# Patient Record
Sex: Male | Born: 1935
Health system: Southern US, Community
[De-identification: ages and names within clinical notes are randomized; demographics above are authoritative.]

## PROBLEM LIST (undated history)

## (undated) DIAGNOSIS — I639 Cerebral infarction, unspecified: Secondary | ICD-10-CM

## (undated) DIAGNOSIS — R7303 Prediabetes: Secondary | ICD-10-CM

## (undated) DIAGNOSIS — E559 Vitamin D deficiency, unspecified: Secondary | ICD-10-CM

## (undated) DIAGNOSIS — I10 Essential (primary) hypertension: Secondary | ICD-10-CM

## (undated) DIAGNOSIS — I2510 Atherosclerotic heart disease of native coronary artery without angina pectoris: Secondary | ICD-10-CM

## (undated) HISTORY — DX: Atherosclerotic heart disease of native coronary artery without angina pectoris: I25.10

## (undated) HISTORY — DX: Essential (primary) hypertension: I10

## (undated) HISTORY — DX: Vitamin D deficiency, unspecified: E55.9

## (undated) HISTORY — DX: Prediabetes: R73.03

---
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
## 1990-08-08 HISTORY — PX: COLECTOMY: SHX59

## 1991-08-09 HISTORY — PX: CATARACT EXTRACTION W/ INTRAOCULAR LENS IMPLANT: SHX1309

## 1997-12-25 ENCOUNTER — Other Ambulatory Visit: Admission: RE | Admit: 1997-12-25 | Discharge: 1997-12-25 | Payer: Self-pay | Admitting: Urology

## 1998-01-01 ENCOUNTER — Ambulatory Visit (HOSPITAL_COMMUNITY): Admission: RE | Admit: 1998-01-01 | Discharge: 1998-01-01 | Payer: Self-pay | Admitting: Internal Medicine

## 1999-08-09 HISTORY — PX: CHOLECYSTECTOMY: SHX55

## 1999-08-09 HISTORY — PX: CATARACT EXTRACTION W/ INTRAOCULAR LENS IMPLANT: SHX1309

## 1999-09-02 ENCOUNTER — Encounter: Payer: Self-pay | Admitting: Surgery

## 1999-09-02 ENCOUNTER — Encounter: Admission: RE | Admit: 1999-09-02 | Discharge: 1999-09-02 | Payer: Self-pay | Admitting: Surgery

## 1999-09-06 ENCOUNTER — Encounter: Payer: Self-pay | Admitting: Surgery

## 1999-09-06 ENCOUNTER — Ambulatory Visit: Admission: RE | Admit: 1999-09-06 | Discharge: 1999-09-06 | Payer: Self-pay | Admitting: Surgery

## 1999-09-10 ENCOUNTER — Encounter: Payer: Self-pay | Admitting: Surgery

## 1999-09-10 ENCOUNTER — Ambulatory Visit (HOSPITAL_COMMUNITY): Admission: RE | Admit: 1999-09-10 | Discharge: 1999-09-11 | Payer: Self-pay | Admitting: Surgery

## 2001-04-26 ENCOUNTER — Other Ambulatory Visit: Admission: RE | Admit: 2001-04-26 | Discharge: 2001-04-26 | Payer: Self-pay | Admitting: Urology

## 2002-01-15 ENCOUNTER — Ambulatory Visit (HOSPITAL_COMMUNITY): Admission: RE | Admit: 2002-01-15 | Discharge: 2002-01-15 | Payer: Self-pay | Admitting: Internal Medicine

## 2002-01-15 ENCOUNTER — Encounter: Payer: Self-pay | Admitting: Internal Medicine

## 2003-05-15 ENCOUNTER — Emergency Department (HOSPITAL_COMMUNITY): Admission: EM | Admit: 2003-05-15 | Discharge: 2003-05-16 | Payer: Self-pay | Admitting: Emergency Medicine

## 2010-11-01 ENCOUNTER — Other Ambulatory Visit: Payer: Self-pay | Admitting: Dermatology

## 2011-05-02 ENCOUNTER — Other Ambulatory Visit: Payer: Self-pay | Admitting: Dermatology

## 2011-05-26 ENCOUNTER — Other Ambulatory Visit: Payer: Self-pay | Admitting: Dermatology

## 2011-08-31 DIAGNOSIS — I10 Essential (primary) hypertension: Secondary | ICD-10-CM | POA: Diagnosis not present

## 2011-08-31 DIAGNOSIS — R7309 Other abnormal glucose: Secondary | ICD-10-CM | POA: Diagnosis not present

## 2011-08-31 DIAGNOSIS — E782 Mixed hyperlipidemia: Secondary | ICD-10-CM | POA: Diagnosis not present

## 2011-08-31 DIAGNOSIS — Z79899 Other long term (current) drug therapy: Secondary | ICD-10-CM | POA: Diagnosis not present

## 2011-08-31 DIAGNOSIS — C61 Malignant neoplasm of prostate: Secondary | ICD-10-CM | POA: Diagnosis not present

## 2011-08-31 DIAGNOSIS — N419 Inflammatory disease of prostate, unspecified: Secondary | ICD-10-CM | POA: Diagnosis not present

## 2011-08-31 DIAGNOSIS — E559 Vitamin D deficiency, unspecified: Secondary | ICD-10-CM | POA: Diagnosis not present

## 2011-10-13 DIAGNOSIS — H1045 Other chronic allergic conjunctivitis: Secondary | ICD-10-CM | POA: Diagnosis not present

## 2011-10-13 DIAGNOSIS — H532 Diplopia: Secondary | ICD-10-CM | POA: Diagnosis not present

## 2011-10-13 DIAGNOSIS — H40019 Open angle with borderline findings, low risk, unspecified eye: Secondary | ICD-10-CM | POA: Diagnosis not present

## 2011-10-13 DIAGNOSIS — E119 Type 2 diabetes mellitus without complications: Secondary | ICD-10-CM | POA: Diagnosis not present

## 2011-10-13 DIAGNOSIS — H04129 Dry eye syndrome of unspecified lacrimal gland: Secondary | ICD-10-CM | POA: Diagnosis not present

## 2011-10-17 DIAGNOSIS — N419 Inflammatory disease of prostate, unspecified: Secondary | ICD-10-CM | POA: Diagnosis not present

## 2011-10-31 ENCOUNTER — Other Ambulatory Visit: Payer: Self-pay | Admitting: Dermatology

## 2011-10-31 DIAGNOSIS — D2370 Other benign neoplasm of skin of unspecified lower limb, including hip: Secondary | ICD-10-CM | POA: Diagnosis not present

## 2011-10-31 DIAGNOSIS — C44711 Basal cell carcinoma of skin of unspecified lower limb, including hip: Secondary | ICD-10-CM | POA: Diagnosis not present

## 2011-12-01 DIAGNOSIS — N3000 Acute cystitis without hematuria: Secondary | ICD-10-CM | POA: Diagnosis not present

## 2011-12-01 DIAGNOSIS — I10 Essential (primary) hypertension: Secondary | ICD-10-CM | POA: Diagnosis not present

## 2011-12-01 DIAGNOSIS — E559 Vitamin D deficiency, unspecified: Secondary | ICD-10-CM | POA: Diagnosis not present

## 2011-12-01 DIAGNOSIS — E782 Mixed hyperlipidemia: Secondary | ICD-10-CM | POA: Diagnosis not present

## 2011-12-01 DIAGNOSIS — R7309 Other abnormal glucose: Secondary | ICD-10-CM | POA: Diagnosis not present

## 2012-01-04 DIAGNOSIS — S20219A Contusion of unspecified front wall of thorax, initial encounter: Secondary | ICD-10-CM | POA: Diagnosis not present

## 2012-03-01 DIAGNOSIS — R7309 Other abnormal glucose: Secondary | ICD-10-CM | POA: Diagnosis not present

## 2012-03-01 DIAGNOSIS — E782 Mixed hyperlipidemia: Secondary | ICD-10-CM | POA: Diagnosis not present

## 2012-03-01 DIAGNOSIS — Z79899 Other long term (current) drug therapy: Secondary | ICD-10-CM | POA: Diagnosis not present

## 2012-03-01 DIAGNOSIS — N3000 Acute cystitis without hematuria: Secondary | ICD-10-CM | POA: Diagnosis not present

## 2012-03-01 DIAGNOSIS — E559 Vitamin D deficiency, unspecified: Secondary | ICD-10-CM | POA: Diagnosis not present

## 2012-03-01 DIAGNOSIS — I10 Essential (primary) hypertension: Secondary | ICD-10-CM | POA: Diagnosis not present

## 2012-03-12 DIAGNOSIS — H02839 Dermatochalasis of unspecified eye, unspecified eyelid: Secondary | ICD-10-CM | POA: Diagnosis not present

## 2012-03-12 DIAGNOSIS — Z961 Presence of intraocular lens: Secondary | ICD-10-CM | POA: Diagnosis not present

## 2012-03-12 DIAGNOSIS — H35379 Puckering of macula, unspecified eye: Secondary | ICD-10-CM | POA: Diagnosis not present

## 2012-03-12 DIAGNOSIS — H538 Other visual disturbances: Secondary | ICD-10-CM | POA: Diagnosis not present

## 2012-03-12 DIAGNOSIS — H04129 Dry eye syndrome of unspecified lacrimal gland: Secondary | ICD-10-CM | POA: Diagnosis not present

## 2012-03-12 DIAGNOSIS — H40019 Open angle with borderline findings, low risk, unspecified eye: Secondary | ICD-10-CM | POA: Diagnosis not present

## 2012-03-12 DIAGNOSIS — H353190 Nonexudative age-related macular degeneration, unspecified eye, stage unspecified: Secondary | ICD-10-CM | POA: Diagnosis not present

## 2012-04-25 DIAGNOSIS — C61 Malignant neoplasm of prostate: Secondary | ICD-10-CM | POA: Diagnosis not present

## 2012-04-30 ENCOUNTER — Other Ambulatory Visit: Payer: Self-pay | Admitting: Dermatology

## 2012-04-30 DIAGNOSIS — C44519 Basal cell carcinoma of skin of other part of trunk: Secondary | ICD-10-CM | POA: Diagnosis not present

## 2012-04-30 DIAGNOSIS — L821 Other seborrheic keratosis: Secondary | ICD-10-CM | POA: Diagnosis not present

## 2012-04-30 DIAGNOSIS — D1801 Hemangioma of skin and subcutaneous tissue: Secondary | ICD-10-CM | POA: Diagnosis not present

## 2012-04-30 DIAGNOSIS — L570 Actinic keratosis: Secondary | ICD-10-CM | POA: Diagnosis not present

## 2012-04-30 DIAGNOSIS — D485 Neoplasm of uncertain behavior of skin: Secondary | ICD-10-CM | POA: Diagnosis not present

## 2012-04-30 DIAGNOSIS — D219 Benign neoplasm of connective and other soft tissue, unspecified: Secondary | ICD-10-CM | POA: Diagnosis not present

## 2012-04-30 DIAGNOSIS — C61 Malignant neoplasm of prostate: Secondary | ICD-10-CM | POA: Diagnosis not present

## 2012-04-30 DIAGNOSIS — R351 Nocturia: Secondary | ICD-10-CM | POA: Diagnosis not present

## 2012-04-30 DIAGNOSIS — D239 Other benign neoplasm of skin, unspecified: Secondary | ICD-10-CM | POA: Diagnosis not present

## 2012-06-05 DIAGNOSIS — E781 Pure hyperglyceridemia: Secondary | ICD-10-CM | POA: Diagnosis not present

## 2012-06-05 DIAGNOSIS — Z79899 Other long term (current) drug therapy: Secondary | ICD-10-CM | POA: Diagnosis not present

## 2012-06-05 DIAGNOSIS — R351 Nocturia: Secondary | ICD-10-CM | POA: Diagnosis not present

## 2012-06-05 DIAGNOSIS — J019 Acute sinusitis, unspecified: Secondary | ICD-10-CM | POA: Diagnosis not present

## 2012-06-05 DIAGNOSIS — E291 Testicular hypofunction: Secondary | ICD-10-CM | POA: Diagnosis not present

## 2012-06-05 DIAGNOSIS — R5383 Other fatigue: Secondary | ICD-10-CM | POA: Diagnosis not present

## 2012-06-05 DIAGNOSIS — I10 Essential (primary) hypertension: Secondary | ICD-10-CM | POA: Diagnosis not present

## 2012-06-05 DIAGNOSIS — R972 Elevated prostate specific antigen [PSA]: Secondary | ICD-10-CM | POA: Diagnosis not present

## 2012-06-05 DIAGNOSIS — Z23 Encounter for immunization: Secondary | ICD-10-CM | POA: Diagnosis not present

## 2012-07-03 DIAGNOSIS — S46819A Strain of other muscles, fascia and tendons at shoulder and upper arm level, unspecified arm, initial encounter: Secondary | ICD-10-CM | POA: Diagnosis not present

## 2012-07-03 DIAGNOSIS — S43499A Other sprain of unspecified shoulder joint, initial encounter: Secondary | ICD-10-CM | POA: Diagnosis not present

## 2012-09-07 DIAGNOSIS — Z79899 Other long term (current) drug therapy: Secondary | ICD-10-CM | POA: Diagnosis not present

## 2012-09-07 DIAGNOSIS — R7309 Other abnormal glucose: Secondary | ICD-10-CM | POA: Diagnosis not present

## 2012-09-07 DIAGNOSIS — C61 Malignant neoplasm of prostate: Secondary | ICD-10-CM | POA: Diagnosis not present

## 2012-09-07 DIAGNOSIS — E782 Mixed hyperlipidemia: Secondary | ICD-10-CM | POA: Diagnosis not present

## 2012-09-07 DIAGNOSIS — E559 Vitamin D deficiency, unspecified: Secondary | ICD-10-CM | POA: Diagnosis not present

## 2012-09-07 DIAGNOSIS — I10 Essential (primary) hypertension: Secondary | ICD-10-CM | POA: Diagnosis not present

## 2012-09-27 ENCOUNTER — Other Ambulatory Visit: Payer: Self-pay | Admitting: Gastroenterology

## 2012-09-27 DIAGNOSIS — K921 Melena: Secondary | ICD-10-CM

## 2012-10-04 ENCOUNTER — Ambulatory Visit
Admission: RE | Admit: 2012-10-04 | Discharge: 2012-10-04 | Disposition: A | Discharge status: Home / Self Care | Payer: Medicare Other | Source: Ambulatory Visit | Attending: Gastroenterology | Admitting: Gastroenterology

## 2012-10-04 ENCOUNTER — Other Ambulatory Visit: Payer: Self-pay | Admitting: Gastroenterology

## 2012-10-04 DIAGNOSIS — K219 Gastro-esophageal reflux disease without esophagitis: Secondary | ICD-10-CM | POA: Diagnosis not present

## 2012-10-04 DIAGNOSIS — K921 Melena: Secondary | ICD-10-CM

## 2012-10-04 DIAGNOSIS — K449 Diaphragmatic hernia without obstruction or gangrene: Secondary | ICD-10-CM | POA: Diagnosis not present

## 2012-10-10 DIAGNOSIS — H04129 Dry eye syndrome of unspecified lacrimal gland: Secondary | ICD-10-CM | POA: Diagnosis not present

## 2012-10-10 DIAGNOSIS — H40019 Open angle with borderline findings, low risk, unspecified eye: Secondary | ICD-10-CM | POA: Diagnosis not present

## 2012-10-10 DIAGNOSIS — H02839 Dermatochalasis of unspecified eye, unspecified eyelid: Secondary | ICD-10-CM | POA: Diagnosis not present

## 2012-10-29 ENCOUNTER — Other Ambulatory Visit: Payer: Self-pay | Admitting: Dermatology

## 2012-10-29 DIAGNOSIS — C44711 Basal cell carcinoma of skin of unspecified lower limb, including hip: Secondary | ICD-10-CM | POA: Diagnosis not present

## 2012-10-29 DIAGNOSIS — L821 Other seborrheic keratosis: Secondary | ICD-10-CM | POA: Diagnosis not present

## 2012-10-29 DIAGNOSIS — L570 Actinic keratosis: Secondary | ICD-10-CM | POA: Diagnosis not present

## 2012-10-29 DIAGNOSIS — D485 Neoplasm of uncertain behavior of skin: Secondary | ICD-10-CM | POA: Diagnosis not present

## 2012-12-10 DIAGNOSIS — H1045 Other chronic allergic conjunctivitis: Secondary | ICD-10-CM | POA: Diagnosis not present

## 2012-12-10 DIAGNOSIS — H40019 Open angle with borderline findings, low risk, unspecified eye: Secondary | ICD-10-CM | POA: Diagnosis not present

## 2012-12-10 DIAGNOSIS — H04129 Dry eye syndrome of unspecified lacrimal gland: Secondary | ICD-10-CM | POA: Diagnosis not present

## 2012-12-18 ENCOUNTER — Other Ambulatory Visit: Payer: Self-pay | Admitting: Dermatology

## 2012-12-18 DIAGNOSIS — Z85828 Personal history of other malignant neoplasm of skin: Secondary | ICD-10-CM | POA: Diagnosis not present

## 2012-12-18 DIAGNOSIS — D485 Neoplasm of uncertain behavior of skin: Secondary | ICD-10-CM | POA: Diagnosis not present

## 2012-12-18 DIAGNOSIS — C44721 Squamous cell carcinoma of skin of unspecified lower limb, including hip: Secondary | ICD-10-CM | POA: Diagnosis not present

## 2013-02-01 DIAGNOSIS — E782 Mixed hyperlipidemia: Secondary | ICD-10-CM | POA: Diagnosis not present

## 2013-02-01 DIAGNOSIS — I10 Essential (primary) hypertension: Secondary | ICD-10-CM | POA: Diagnosis not present

## 2013-02-01 DIAGNOSIS — L089 Local infection of the skin and subcutaneous tissue, unspecified: Secondary | ICD-10-CM | POA: Diagnosis not present

## 2013-02-01 DIAGNOSIS — E559 Vitamin D deficiency, unspecified: Secondary | ICD-10-CM | POA: Diagnosis not present

## 2013-02-01 DIAGNOSIS — W57XXXA Bitten or stung by nonvenomous insect and other nonvenomous arthropods, initial encounter: Secondary | ICD-10-CM | POA: Diagnosis not present

## 2013-02-01 DIAGNOSIS — R7309 Other abnormal glucose: Secondary | ICD-10-CM | POA: Diagnosis not present

## 2013-02-01 DIAGNOSIS — Z79899 Other long term (current) drug therapy: Secondary | ICD-10-CM | POA: Diagnosis not present

## 2013-04-10 DIAGNOSIS — H353190 Nonexudative age-related macular degeneration, unspecified eye, stage unspecified: Secondary | ICD-10-CM | POA: Diagnosis not present

## 2013-04-10 DIAGNOSIS — H40019 Open angle with borderline findings, low risk, unspecified eye: Secondary | ICD-10-CM | POA: Diagnosis not present

## 2013-04-10 DIAGNOSIS — Z961 Presence of intraocular lens: Secondary | ICD-10-CM | POA: Diagnosis not present

## 2013-04-10 DIAGNOSIS — H04129 Dry eye syndrome of unspecified lacrimal gland: Secondary | ICD-10-CM | POA: Diagnosis not present

## 2013-04-10 DIAGNOSIS — H35379 Puckering of macula, unspecified eye: Secondary | ICD-10-CM | POA: Diagnosis not present

## 2013-04-10 DIAGNOSIS — H43819 Vitreous degeneration, unspecified eye: Secondary | ICD-10-CM | POA: Diagnosis not present

## 2013-04-10 DIAGNOSIS — H35039 Hypertensive retinopathy, unspecified eye: Secondary | ICD-10-CM | POA: Diagnosis not present

## 2013-04-10 DIAGNOSIS — H4720 Unspecified optic atrophy: Secondary | ICD-10-CM | POA: Diagnosis not present

## 2013-05-01 ENCOUNTER — Other Ambulatory Visit: Payer: Self-pay | Admitting: Dermatology

## 2013-05-01 DIAGNOSIS — D1801 Hemangioma of skin and subcutaneous tissue: Secondary | ICD-10-CM | POA: Diagnosis not present

## 2013-05-01 DIAGNOSIS — L821 Other seborrheic keratosis: Secondary | ICD-10-CM | POA: Diagnosis not present

## 2013-05-01 DIAGNOSIS — L570 Actinic keratosis: Secondary | ICD-10-CM | POA: Diagnosis not present

## 2013-05-01 DIAGNOSIS — C4441 Basal cell carcinoma of skin of scalp and neck: Secondary | ICD-10-CM | POA: Diagnosis not present

## 2013-05-01 DIAGNOSIS — C4320 Malignant melanoma of unspecified ear and external auricular canal: Secondary | ICD-10-CM | POA: Diagnosis not present

## 2013-05-01 DIAGNOSIS — Z85828 Personal history of other malignant neoplasm of skin: Secondary | ICD-10-CM | POA: Diagnosis not present

## 2013-05-01 DIAGNOSIS — D485 Neoplasm of uncertain behavior of skin: Secondary | ICD-10-CM | POA: Diagnosis not present

## 2013-05-01 DIAGNOSIS — C44519 Basal cell carcinoma of skin of other part of trunk: Secondary | ICD-10-CM | POA: Diagnosis not present

## 2013-05-13 DIAGNOSIS — C61 Malignant neoplasm of prostate: Secondary | ICD-10-CM | POA: Diagnosis not present

## 2013-05-21 DIAGNOSIS — C61 Malignant neoplasm of prostate: Secondary | ICD-10-CM | POA: Diagnosis not present

## 2013-05-21 DIAGNOSIS — R351 Nocturia: Secondary | ICD-10-CM | POA: Diagnosis not present

## 2013-05-21 DIAGNOSIS — R3913 Splitting of urinary stream: Secondary | ICD-10-CM | POA: Diagnosis not present

## 2013-06-03 DIAGNOSIS — E782 Mixed hyperlipidemia: Secondary | ICD-10-CM | POA: Diagnosis not present

## 2013-06-03 DIAGNOSIS — E559 Vitamin D deficiency, unspecified: Secondary | ICD-10-CM | POA: Diagnosis not present

## 2013-06-03 DIAGNOSIS — Z23 Encounter for immunization: Secondary | ICD-10-CM | POA: Diagnosis not present

## 2013-06-03 DIAGNOSIS — R7309 Other abnormal glucose: Secondary | ICD-10-CM | POA: Diagnosis not present

## 2013-06-03 DIAGNOSIS — Z79899 Other long term (current) drug therapy: Secondary | ICD-10-CM | POA: Diagnosis not present

## 2013-06-03 DIAGNOSIS — I10 Essential (primary) hypertension: Secondary | ICD-10-CM | POA: Diagnosis not present

## 2013-07-08 ENCOUNTER — Other Ambulatory Visit: Payer: Self-pay | Admitting: Physician Assistant

## 2013-07-08 MED ORDER — ALPRAZOLAM 1 MG PO TABS: 1.0000 mg | ORAL_TABLET | Freq: Three times a day (TID) | ORAL | Status: DC | PRN

## 2013-09-08 DIAGNOSIS — R7309 Other abnormal glucose: Secondary | ICD-10-CM | POA: Insufficient documentation

## 2013-09-08 DIAGNOSIS — I10 Essential (primary) hypertension: Secondary | ICD-10-CM | POA: Insufficient documentation

## 2013-09-08 DIAGNOSIS — I2510 Atherosclerotic heart disease of native coronary artery without angina pectoris: Secondary | ICD-10-CM | POA: Insufficient documentation

## 2013-09-10 ENCOUNTER — Other Ambulatory Visit: Payer: Self-pay | Admitting: Internal Medicine

## 2013-09-12 ENCOUNTER — Encounter: Payer: Self-pay | Admitting: Internal Medicine

## 2013-09-12 ENCOUNTER — Ambulatory Visit (INDEPENDENT_AMBULATORY_CARE_PROVIDER_SITE_OTHER): Payer: Medicare Other | Admitting: Internal Medicine

## 2013-09-12 VITALS — BP 150/70 | HR 56 | Temp 97.9°F | Resp 16 | Ht 71.5 in | Wt 186.0 lb

## 2013-09-12 DIAGNOSIS — Z79899 Other long term (current) drug therapy: Secondary | ICD-10-CM | POA: Diagnosis not present

## 2013-09-12 DIAGNOSIS — E559 Vitamin D deficiency, unspecified: Secondary | ICD-10-CM | POA: Diagnosis not present

## 2013-09-12 DIAGNOSIS — E782 Mixed hyperlipidemia: Secondary | ICD-10-CM | POA: Insufficient documentation

## 2013-09-12 DIAGNOSIS — R7309 Other abnormal glucose: Secondary | ICD-10-CM | POA: Diagnosis not present

## 2013-09-12 DIAGNOSIS — Z0000 Encounter for general adult medical examination without abnormal findings: Secondary | ICD-10-CM

## 2013-09-12 DIAGNOSIS — I10 Essential (primary) hypertension: Secondary | ICD-10-CM

## 2013-09-12 DIAGNOSIS — Z125 Encounter for screening for malignant neoplasm of prostate: Secondary | ICD-10-CM

## 2013-09-12 DIAGNOSIS — Z1212 Encounter for screening for malignant neoplasm of rectum: Secondary | ICD-10-CM

## 2013-09-12 DIAGNOSIS — R7303 Prediabetes: Secondary | ICD-10-CM

## 2013-09-12 LAB — HEPATIC FUNCTION PANEL
ALK PHOS: 69 U/L (ref 39–117)
ALT: 12 U/L (ref 0–53)
AST: 16 U/L (ref 0–37)
Albumin: 3.6 g/dL (ref 3.5–5.2)
BILIRUBIN DIRECT: 0.1 mg/dL (ref 0.0–0.3)
BILIRUBIN INDIRECT: 0.4 mg/dL (ref 0.2–1.2)
Total Bilirubin: 0.5 mg/dL (ref 0.2–1.2)
Total Protein: 6.1 g/dL (ref 6.0–8.3)

## 2013-09-12 LAB — CBC WITH DIFFERENTIAL/PLATELET
Basophils Absolute: 0 10*3/uL (ref 0.0–0.1)
Basophils Relative: 0 % (ref 0–1)
EOS ABS: 0.1 10*3/uL (ref 0.0–0.7)
Eosinophils Relative: 2 % (ref 0–5)
HCT: 40.5 % (ref 39.0–52.0)
HEMOGLOBIN: 14.1 g/dL (ref 13.0–17.0)
LYMPHS ABS: 1.2 10*3/uL (ref 0.7–4.0)
Lymphocytes Relative: 22 % (ref 12–46)
MCH: 32 pg (ref 26.0–34.0)
MCHC: 34.8 g/dL (ref 30.0–36.0)
MCV: 92 fL (ref 78.0–100.0)
MONOS PCT: 14 % — AB (ref 3–12)
Monocytes Absolute: 0.8 10*3/uL (ref 0.1–1.0)
NEUTROS PCT: 62 % (ref 43–77)
Neutro Abs: 3.4 10*3/uL (ref 1.7–7.7)
Platelets: 216 10*3/uL (ref 150–400)
RBC: 4.4 MIL/uL (ref 4.22–5.81)
RDW: 13.6 % (ref 11.5–15.5)
WBC: 5.5 10*3/uL (ref 4.0–10.5)

## 2013-09-12 LAB — BASIC METABOLIC PANEL WITH GFR
BUN: 14 mg/dL (ref 6–23)
CALCIUM: 9.3 mg/dL (ref 8.4–10.5)
CO2: 28 meq/L (ref 19–32)
Chloride: 104 mEq/L (ref 96–112)
Creat: 0.95 mg/dL (ref 0.50–1.35)
GFR, Est African American: 82 mL/min
GFR, Est Non African American: 71 mL/min
GLUCOSE: 90 mg/dL (ref 70–99)
POTASSIUM: 4.7 meq/L (ref 3.5–5.3)
SODIUM: 140 meq/L (ref 135–145)

## 2013-09-12 LAB — LIPID PANEL
CHOL/HDL RATIO: 3.3 ratio
Cholesterol: 144 mg/dL (ref 0–200)
HDL: 43 mg/dL (ref >39)
LDL CALC: 66 mg/dL (ref 0–99)
Triglycerides: 175 mg/dL — ABNORMAL HIGH (ref <150)
VLDL: 35 mg/dL (ref 0–40)

## 2013-09-12 LAB — TSH: TSH: 3.007 u[IU]/mL (ref 0.350–4.500)

## 2013-09-12 LAB — HEMOGLOBIN A1C
HEMOGLOBIN A1C: 5.1 % (ref <5.7)
Mean Plasma Glucose: 100 mg/dL (ref <117)

## 2013-09-12 LAB — MAGNESIUM: Magnesium: 1.8 mg/dL (ref 1.5–2.5)

## 2013-09-12 MED ORDER — VITAMIN D 50 MCG (2000 UT) PO TABS: 4000.0000 [IU] | ORAL_TABLET | Freq: Every day | ORAL | Status: DC

## 2013-09-12 MED ORDER — DIGOXIN 250 MCG PO TABS: 0.2500 mg | ORAL_TABLET | Freq: Every day | ORAL | Status: DC

## 2013-09-12 NOTE — Progress Notes (Signed)
Patient ID: Joseph Cherry, male   DOB: 11/20/1921, 80 y.o.   MRN: [id number]    Annual Screening Comprehensive Examination  This very nice 80 y.o.  WWM presents for complete physical.  Patient has been followed for HTN, AASHD/pAT, Prediabetes, Hyperlipidemia, and Vitamin D Deficiency.   HTN predates since 55. Patient's BP has been controlled at home.Today's BP: 150/70 mmHg. He had a negative Cardiolyte in 2004. He also has a lon hHx/o PAT , albeit very infrequent now with reported rare transient fluttering palpitations. EKG today was suspicious for AVD and difficult to evaluate baseline. Patient denies any cardiac symptoms as chest pain, palpitations, shortness of breath, dizziness, light-headedness  or ankle swelling.   Patient's hyperlipidemia is controlled with diet and medications. Patient denies myalgias or other medication SE's. Last cholesterol last visit was 136, triglycerides 138, HDL 39 and LDL 69.     Patient has prediabetes/insulin resistance with last A1c 5.6% with slightly elevated insulin in 2008. Patient denies reactive hypoglycemic symptoms, visual blurring, diabetic polys, or paresthesias.    Finally, patient has history of Vitamin D Deficiency of 30 in 2008 and with last vitamin D59 in Oct 2014.      Medication List         ALPRAZolam 1 MG tablet  Commonly known as:  XANAX  Take 1 tablet (1 mg total) by mouth 3 (three) times daily as needed for anxiety or sleep.     aspirin 81 MG chewable tablet  Chew by mouth daily.     azelastine 0.05 % ophthalmic solution  Commonly known as:  OPTIVAR     digoxin 0.25 MG tablet  Commonly known as:  LANOXIN  Take 1 tablet (0.25 mg total) by mouth daily.     finasteride 5 MG tablet  Commonly known as:  PROSCAR  Take 5 mg by mouth daily.     fluticasone 50 MCG/ACT nasal spray  Commonly known as:  FLONASE     losartan 100 MG tablet  Commonly known as:  COZAAR  Take 100 mg by mouth daily.     montelukast 10 MG tablet   Commonly known as:  SINGULAIR  Take 10 mg by mouth at bedtime.     ranitidine 300 MG tablet  Commonly known as:  ZANTAC  Take 300 mg by mouth at bedtime.     verapamil 240 MG CR tablet  Commonly known as:  CALAN-SR  TAKE 1 TABLET TWICE DAILY AFTER MEALS FOR HEART RHYTHM.     Vitamin D 2000 UNITS tablet  Take 2 tablets (4,000 Units total) by mouth daily.        Allergies  Allergen Reactions  . Demerol [Meperidine]   . Penicillins     Past Medical History  Diagnosis Date  . Hypertension   . Prediabetes   . Vitamin D deficiency    Prostate Cancer, Hx/o    S/P total Colectomy for Diverticular Dz   . ASHD Hx/o PAT     Past Surgical History  Procedure Laterality Date  . Colectomy  1992  . Cataract extraction w/ intraocular lens implant Right 1993  . Cholecystectomy  2001  . Cataract extraction w/ intraocular lens implant Left 2001    Family History  Problem Relation Age of Onset  . AAA (abdominal aortic aneurysm) Mother   . Heart disease Mother   . Hypertension Mother   . Heart disease Father   . Heart disease Sister   . Early death Brother  at birth    History   Social History  . Marital Status: Widowed    Spouse Name: N/A    Number of Children: N/A  . Years of Education: N/A   Occupational History  . Not on file.   Social History Main Topics  . Smoking status: Current Some Day Smoker    Types: Cigars  . Smokeless tobacco: Not on file  . Alcohol Use: Yes     Comment: occ  . Drug Use: No  . Sexual Activity: Not on file   Other Topics Concern  . Not on file   Social History Narrative  . No narrative on file    ROS Constitutional: Denies fever, chills, weight loss/gain, headaches, insomnia, fatigue, night sweats, and change in appetite. Eyes: Denies redness, blurred vision, diplopia, discharge, itchy, watery eyes.  ENT: Denies discharge, congestion, post nasal drip, epistaxis, sore throat, earache, hearing loss, dental pain, Tinnitus,  Vertigo, Sinus pain, snoring.  Cardio: Denies chest pain, palpitations, irregular heartbeat, syncope, dyspnea, diaphoresis, orthopnea, PND, claudication, edema Respiratory: denies cough, dyspnea, DOE, pleurisy, hoarseness, laryngitis, wheezing.  Gastrointestinal: Denies dysphagia, heartburn, reflux, water brash, pain, cramps, nausea, vomiting, bloating, diarrhea, constipation, hematemesis, melena, hematochezia, jaundice, hemorrhoids Genitourinary: Denies dysuria, frequency, urgency, nocturia, hesitancy, discharge, hematuria, flank pain Musculoskeletal: Denies arthralgia, myalgia, stiffness, Jt. Swelling, pain, limp, and strain/sprain. Skin: Denies puritis, rash, hives, warts, acne, eczema, changing in skin lesion Neuro: No weakness, tremor, incoordination, spasms, paresthesia, pain Psychiatric: Denies confusion, memory loss, sensory loss Endocrine: Denies change in weight, skin, hair change, nocturia, and paresthesia, diabetic polys, visual blurring, hyper / hypo glycemic episodes.  Heme/Lymph: No excessive bleeding, bruising, or elarged lymph nodes.  BP: 150/70  Pulse: 56  Temp: 97.9 F (36.6 C)  Resp: 16    Estimated body mass index is 25.58 kg/(m^2) as calculated from the following:   Height as of this encounter: 5' 11.5" (1.816 m).   Weight as of this encounter: 186 lb (84.369 kg).  Physical Exam General Appearance: Well nourished, in no apparent distress. Eyes: PERRLA, EOMs, conjunctiva no swelling or erythema, normal fundi and vessels. Sinuses: No frontal/maxillary tenderness ENT/Mouth: EACs patent / TMs  nl. Nares clear without erythema, swelling, mucoid exudates. Oral hygiene is good. No erythema, swelling, or exudate. Tongue normal, non-obstructing. Tonsils not swollen or erythematous. Hearing normal.  Neck: Supple, thyroid normal. No bruits, nodes or JVD. Respiratory: Respiratory effort normal.  BS equal and clear bilateral without rales, rhonci, wheezing or stridor. Cardio:  Heart sounds are normal with regular rate and rhythm and no murmurs, rubs or gallops. Peripheral pulses are normal and equal bilaterally without edema. No aortic or femoral bruits. Chest: symmetric with normal excursions and percussion.  Abdomen: Flat, soft, with bowl sounds. Nontender, no guarding, rebound, hernias, masses, or organomegaly.  Lymphatics: Non tender without lymphadenopathy.  Genitourinary: Deferred to Dr Gaynelle Arabian Musculoskeletal: Full ROM all peripheral extremities, joint stability, 5/5 strength, and normal gait. Skin: Warm and dry without rashes, lesions, cyanosis, clubbing or  ecchymosis.  Neuro: Cranial nerves intact, reflexes equal bilaterally. Normal muscle tone, no cerebellar symptoms. Sensation intact.  Pysch: Awake and oriented X 3, normal affect, insight and judgment appropriate.   Assessment and Plan  1. Annual Screening Examination 2. Hypertension  3. Hyperlipidemia 4. Pre Diabetes/Insulin Resistance 5. Vitamin D Deficiency 6. ASHD/PHx/o PAT ? AVD / or SSS possibly secondary to Dig Toxicity - As patient is having absolutely no cardiac Sx's - he is advised to D/C Lanoxin pending lab results and  further disposition all labs. Advised if develops dizziness, fainting, etc to call 911 and go to ER - that he may need a pacemaker  Continue prudent diet as discussed, weight control, BP monitoring, regular exercise, and medications as discussed.  Discussed med effects and SE's. Routine screening labs and tests as requested with regular follow-up as recommended.

## 2013-09-12 NOTE — Patient Instructions (Signed)

## 2013-09-13 LAB — MICROALBUMIN / CREATININE URINE RATIO
Creatinine, Urine: 177 mg/dL
MICROALB UR: 0.62 mg/dL (ref 0.00–1.89)
Microalb Creat Ratio: 3.5 mg/g (ref 0.0–30.0)

## 2013-09-13 LAB — URINALYSIS, MICROSCOPIC ONLY
BACTERIA UA: NONE SEEN
CASTS: NONE SEEN
Crystals: NONE SEEN
Squamous Epithelial / LPF: NONE SEEN

## 2013-09-13 LAB — DIGOXIN LEVEL: Digoxin Level: 1.4 ng/mL (ref 0.8–2.0)

## 2013-09-13 LAB — INSULIN, FASTING: Insulin fasting, serum: 23 u[IU]/mL (ref 3–28)

## 2013-09-13 LAB — VITAMIN D 25 HYDROXY (VIT D DEFICIENCY, FRACTURES): Vit D, 25-Hydroxy: 75 ng/mL (ref 30–89)

## 2013-09-18 ENCOUNTER — Ambulatory Visit (INDEPENDENT_AMBULATORY_CARE_PROVIDER_SITE_OTHER): Payer: Medicare Other | Admitting: Internal Medicine

## 2013-09-18 ENCOUNTER — Other Ambulatory Visit: Payer: Self-pay | Admitting: Internal Medicine

## 2013-09-18 ENCOUNTER — Encounter: Payer: Self-pay | Admitting: Internal Medicine

## 2013-09-18 VITALS — BP 132/64 | HR 64 | Temp 97.7°F | Resp 18 | Wt 184.0 lb

## 2013-09-18 DIAGNOSIS — I10 Essential (primary) hypertension: Secondary | ICD-10-CM

## 2013-09-18 DIAGNOSIS — I2510 Atherosclerotic heart disease of native coronary artery without angina pectoris: Secondary | ICD-10-CM

## 2013-09-18 MED ORDER — FLUTICASONE PROPIONATE 50 MCG/ACT NA SUSP: 2.0000 | Freq: Two times a day (BID) | NASAL | Status: DC

## 2013-09-18 NOTE — Progress Notes (Signed)
Subjective:    Patient ID: Joseph Cherry, male    DOB: [birth year], 80 y.o.   MRN: [id number]   Patient had recent CPE and EKG showed Complete AV Block and as his VS were normal and he had no cardiac Sx's , his Digoxin was D/C'd and he returns today for repeat EKG. Since off Digoxin , he's had only occasional palpitations.  Palpitations  This is a chronic problem. The current episode started more than 1 year ago. The problem occurs intermittently. The problem has been gradually improving. Pertinent negatives include no anxiety, chest fullness, chest pain, coughing, diaphoresis, dizziness, fever, irregular heartbeat, malaise/fatigue, nausea, near-syncope, numbness, shortness of breath or syncope.     Medication List       This list is accurate as of: 09/18/13  1:13 PM.  Always use your most recent med list.               ALPRAZolam 1 MG tablet  Commonly known as:  XANAX  Take 1 tablet (1 mg total) by mouth 3 (three) times daily as needed for anxiety or sleep.     aspirin 81 MG chewable tablet  Chew by mouth daily.     azelastine 0.05 % ophthalmic solution  Commonly known as:  OPTIVAR     digoxin 0.25 MG tablet  Commonly known as:  LANOXIN  Take 1 tablet (0.25 mg total) by mouth daily.     finasteride 5 MG tablet  Commonly known as:  PROSCAR  Take 5 mg by mouth daily.     fluticasone 50 MCG/ACT nasal spray  Commonly known as:  FLONASE  Place 2 sprays into both nostrils 2 (two) times daily.     losartan 100 MG tablet  Commonly known as:  COZAAR  Take 100 mg by mouth daily.     montelukast 10 MG tablet  Commonly known as:  SINGULAIR  Take 10 mg by mouth at bedtime.     ranitidine 300 MG tablet  Commonly known as:  ZANTAC  Take 300 mg by mouth at bedtime.     verapamil 240 MG CR tablet  Commonly known as:  CALAN-SR  TAKE 1 TABLET TWICE DAILY AFTER MEALS FOR HEART RHYTHM.     Vitamin D 2000 UNITS tablet  Take 2 tablets (4,000 Units total) by mouth daily.        Allergies  Allergen Reactions  . Demerol [Meperidine]   . Penicillins    Past Medical History  Diagnosis Date  . Hypertension   . Prediabetes   . Vitamin D deficiency   . ASHD (arteriosclerotic heart disease)       Review of Systems  Constitutional: Negative for fever, malaise/fatigue and diaphoresis.  Respiratory: Negative for cough and shortness of breath.   Cardiovascular: Positive for palpitations. Negative for chest pain, syncope and near-syncope.  Gastrointestinal: Negative for nausea.  Neurological: Negative for dizziness and numbness.       Objective:   Physical Exam  Constitutional: He is oriented to person, place, and time. He appears well-developed and well-nourished.  HENT:  Head: Normocephalic and atraumatic.  Mouth/Throat: Oropharynx is clear and moist.  Eyes: Conjunctivae and EOM are normal. Pupils are equal, round, and reactive to light.  Neck: Normal range of motion. Neck supple. No thyromegaly present.  Cardiovascular: Normal rate, regular rhythm and normal heart sounds.  Exam reveals no gallop.   No murmur heard. Pulmonary/Chest: Effort normal and breath sounds normal. No respiratory distress. He has no wheezes.  He has no rales. He exhibits no tenderness.  Abdominal: Soft. Bowel sounds are normal.  Musculoskeletal: Normal range of motion. He exhibits no edema.  Lymphadenopathy:    He has no cervical adenopathy.  Neurological: He is alert and oriented to person, place, and time. No cranial nerve deficit. Coordination normal.  Skin: Skin is dry. No rash noted. No erythema. No pallor.   Assessment & Plan:  1. Hypertension  2. Coronary atherosclerosis of unspecified type of vessel, native or graft  - EKG 12-Lead shows NSR with 1st degree Block and NO 2sd or 3rd degree block

## 2013-10-02 ENCOUNTER — Other Ambulatory Visit: Payer: Self-pay | Admitting: Physician Assistant

## 2013-10-02 MED ORDER — ALPRAZOLAM 1 MG PO TABS: 1.0000 mg | ORAL_TABLET | Freq: Three times a day (TID) | ORAL | Status: DC | PRN

## 2013-10-16 ENCOUNTER — Other Ambulatory Visit: Payer: Self-pay | Admitting: Internal Medicine

## 2013-10-30 ENCOUNTER — Other Ambulatory Visit: Payer: Self-pay | Admitting: Dermatology

## 2013-10-30 DIAGNOSIS — Z85828 Personal history of other malignant neoplasm of skin: Secondary | ICD-10-CM | POA: Diagnosis not present

## 2013-10-30 DIAGNOSIS — L570 Actinic keratosis: Secondary | ICD-10-CM | POA: Diagnosis not present

## 2013-10-30 DIAGNOSIS — L821 Other seborrheic keratosis: Secondary | ICD-10-CM | POA: Diagnosis not present

## 2013-10-30 DIAGNOSIS — D485 Neoplasm of uncertain behavior of skin: Secondary | ICD-10-CM | POA: Diagnosis not present

## 2013-10-30 DIAGNOSIS — Z85820 Personal history of malignant melanoma of skin: Secondary | ICD-10-CM | POA: Diagnosis not present

## 2013-10-30 DIAGNOSIS — C44611 Basal cell carcinoma of skin of unspecified upper limb, including shoulder: Secondary | ICD-10-CM | POA: Diagnosis not present

## 2013-10-30 DIAGNOSIS — D1801 Hemangioma of skin and subcutaneous tissue: Secondary | ICD-10-CM | POA: Diagnosis not present

## 2013-11-13 DIAGNOSIS — H40019 Open angle with borderline findings, low risk, unspecified eye: Secondary | ICD-10-CM | POA: Diagnosis not present

## 2013-11-13 DIAGNOSIS — H0019 Chalazion unspecified eye, unspecified eyelid: Secondary | ICD-10-CM | POA: Diagnosis not present

## 2013-11-25 ENCOUNTER — Other Ambulatory Visit: Payer: Self-pay | Admitting: *Deleted

## 2013-11-25 ENCOUNTER — Ambulatory Visit (INDEPENDENT_AMBULATORY_CARE_PROVIDER_SITE_OTHER): Payer: Medicare Other | Admitting: Internal Medicine

## 2013-11-25 ENCOUNTER — Encounter: Payer: Self-pay | Admitting: Internal Medicine

## 2013-11-25 VITALS — BP 148/74 | HR 108 | Temp 99.0°F | Resp 16 | Ht 72.0 in | Wt 181.8 lb

## 2013-11-25 DIAGNOSIS — F05 Delirium due to known physiological condition: Secondary | ICD-10-CM

## 2013-11-25 DIAGNOSIS — I10 Essential (primary) hypertension: Secondary | ICD-10-CM

## 2013-11-25 DIAGNOSIS — Z79899 Other long term (current) drug therapy: Secondary | ICD-10-CM

## 2013-11-25 DIAGNOSIS — R509 Fever, unspecified: Secondary | ICD-10-CM

## 2013-11-25 DIAGNOSIS — I2510 Atherosclerotic heart disease of native coronary artery without angina pectoris: Secondary | ICD-10-CM

## 2013-11-25 LAB — CBC WITH DIFFERENTIAL/PLATELET
BASOS ABS: 0 10*3/uL (ref 0.0–0.1)
BASOS PCT: 0 % (ref 0–1)
EOS ABS: 0 10*3/uL (ref 0.0–0.7)
Eosinophils Relative: 0 % (ref 0–5)
HEMATOCRIT: 40.2 % (ref 39.0–52.0)
Hemoglobin: 14.1 g/dL (ref 13.0–17.0)
Lymphocytes Relative: 10 % — ABNORMAL LOW (ref 12–46)
Lymphs Abs: 1 10*3/uL (ref 0.7–4.0)
MCH: 32.3 pg (ref 26.0–34.0)
MCHC: 35.1 g/dL (ref 30.0–36.0)
MCV: 92.2 fL (ref 78.0–100.0)
MONO ABS: 0.6 10*3/uL (ref 0.1–1.0)
Monocytes Relative: 6 % (ref 3–12)
NEUTROS ABS: 8.1 10*3/uL — AB (ref 1.7–7.7)
Neutrophils Relative %: 84 % — ABNORMAL HIGH (ref 43–77)
Platelets: 287 10*3/uL (ref 150–400)
RBC: 4.36 MIL/uL (ref 4.22–5.81)
RDW: 13 % (ref 11.5–15.5)
WBC: 9.7 10*3/uL (ref 4.0–10.5)

## 2013-11-25 LAB — BASIC METABOLIC PANEL WITH GFR
BUN: 11 mg/dL (ref 6–23)
CHLORIDE: 99 meq/L (ref 96–112)
CO2: 27 meq/L (ref 19–32)
CREATININE: 0.92 mg/dL (ref 0.50–1.35)
Calcium: 9.4 mg/dL (ref 8.4–10.5)
GFR, EST NON AFRICAN AMERICAN: 73 mL/min
GFR, Est African American: 85 mL/min
Glucose, Bld: 122 mg/dL — ABNORMAL HIGH (ref 70–99)
POTASSIUM: 4.3 meq/L (ref 3.5–5.3)
Sodium: 137 mEq/L (ref 135–145)

## 2013-11-25 LAB — HEPATIC FUNCTION PANEL
ALK PHOS: 79 U/L (ref 39–117)
ALT: 12 U/L (ref 0–53)
AST: 20 U/L (ref 0–37)
Albumin: 4.1 g/dL (ref 3.5–5.2)
BILIRUBIN DIRECT: 0.2 mg/dL (ref 0.0–0.3)
BILIRUBIN INDIRECT: 0.7 mg/dL (ref 0.2–1.2)
TOTAL PROTEIN: 6.9 g/dL (ref 6.0–8.3)
Total Bilirubin: 0.9 mg/dL (ref 0.2–1.2)

## 2013-11-25 MED ORDER — RANITIDINE HCL 300 MG PO TABS: 300.0000 mg | ORAL_TABLET | Freq: Every day | ORAL | Status: DC

## 2013-11-25 NOTE — Patient Instructions (Signed)
Confusion Confusion is the inability to think with your usual speed or clarity. Confusion may come on quickly or slowly over time. How quickly the confusion comes on depends on the cause. Confusion can be due to any number of causes. CAUSES   Concussion, head injury, or head trauma.  Seizures.  Stroke.  Fever.  Senility.  Heightened emotional states like rage or terror.  Mental illness in which the person loses the ability to determine what is real and what is not (hallucinations).  Infections.  Toxic effects from alcohol, drugs, or prescription medicines.  Dehydration and an imbalance of salts in the body (electrolytes).  Lack of sleep.  Low blood sugar (diabetes).  Low levels of oxygen (for example from chronic lung disorders).  Drug interactions or other medication side effects.  Nutritional deficiencies, especially niacin, thiamine, vitamin C, or vitamin B.  Sudden drop in body temperature (hypothermia).  Illness in the elderly. Constipation can result in confusion. An elderly person who is hospitalized may become confused due to change in daily routine. SYMPTOMS  People often describe their thinking as cloudy or unclear when they are confused. Confusion can also include feeling disoriented. That means you are unaware of where or who you are. You may also not know what the date or time is. If confused, you may also have difficulty paying attention, remembering and making decisions. Some people also act aggressively when they are confused.  DIAGNOSIS  The medical evaluation of confusion may include:  Blood and urine tests.  X-rays.  Brain and nervous system tests.  Analyzing your brain waves (electroencphalogram or EEG).  A special X-ray (MRI) of your head or other special studies. Your physician will ask questions such as:  Do you get days and nights mixed up?  Are you awake during regular sleep times?  Do you have trouble recognizing people?  Do you  know where you are?  Do you know the date and time?  Does the confusion come and go?  Is the confusion quickly getting worse?  Has there been a recent illness?  Has there been a recent head injury?  Are you diabetic?  Do you have a lung disorder?  What medication are you taking?  Have you taken drugs or alcohol? TREATMENT  An admission to the hospital may not be needed, but a confused person should not be left alone. Stay with a family member or friend until the confusion clears. Avoid alcohol, pain relievers or sedative drugs until you have fully recovered. Do not drive until your caregiver says it is okay. HOME CARE INSTRUCTIONS What family and friends can do:  To find out if someone is confused ask him or her their name, age, and the date. If the person is unsure or answers incorrectly, he or she is confused.  Always introduce yourself, no matter how well the person knows you.  Often remind the person of his or her location.  Place a calendar and clock near the confused person.  Talk about current events and plans for the day.  Try to keep the environment calm, quiet and peaceful.  Make sure the patient keeps follow up appointments with their physician. PREVENTION  Ways to prevent confusion:  Avoid alcohol.  Eat a balanced diet.  Get enough sleep.  Do not become isolated. Spend time with other people and make plans for your days.  Keep careful watch on your blood sugar levels if you are diabetic. SEEK IMMEDIATE MEDICAL CARE IF:   You develop  severe headaches, repeated vomiting, seizures, blackouts or slurred speech.  There is increasing confusion, weakness, numbness, restlessness or personality changes.  You develop a loss of balance, have marked dizziness, feel uncoordinated or fall.  You have delusions, hallucinations or develop severe anxiety.  Your family members think you need to be rechecked. Document Released: 09/01/2004 Document Revised:  10/17/2011 Document Reviewed: 04/29/2008 [employer] Patient Information 2014 [place], [place].   Fever, Adult A fever is a higher than normal body temperature. In an adult, an oral temperature around 98.6 F (37 C) is considered normal. A temperature of 100.4 F (38 C) or higher is generally considered a fever. Mild or moderate fevers generally have no long-term effects and often do not require treatment. Extreme fever (greater than or equal to 106 F or 41.1 C) can cause seizures. The sweating that may occur with repeated or prolonged fever may cause dehydration. Elderly people can develop confusion during a fever. A measured temperature can vary with:  Age.  Time of day.  Method of measurement (mouth, underarm, rectal, or ear). The fever is confirmed by taking a temperature with a thermometer. Temperatures can be taken different ways. Some methods are accurate and some are not.  An oral temperature is used most commonly. Electronic thermometers are fast and accurate.  An ear temperature will only be accurate if the thermometer is positioned as recommended by the manufacturer.  A rectal temperature is accurate and done for those adults who have a condition where an oral temperature cannot be taken.  An underarm (axillary) temperature is not accurate and not recommended. Fever is a symptom, not a disease.  CAUSES   Infections commonly cause fever.  Some noninfectious causes for fever include:  Some arthritis conditions.  Some thyroid or adrenal gland conditions.  Some immune system conditions.  Some types of cancer.  A medicine reaction.  High doses of certain street drugs such as methamphetamine.  Dehydration.  Exposure to high outside or room temperatures.  Occasionally, the source of a fever cannot be determined. This is sometimes called a "fever of unknown origin" (FUO).  Some situations may lead to a temporary rise in body temperature that may go away on its own.  Examples are:  Childbirth.  Surgery.  Intense exercise. HOME CARE INSTRUCTIONS   Take appropriate medicines for fever. Follow dosing instructions carefully. If you use acetaminophen to reduce the fever, be careful to avoid taking other medicines that also contain acetaminophen. Do not take aspirin for a fever if you are younger than age 17. There is an association with Reye's syndrome. Reye's syndrome is a rare but potentially deadly disease.  If an infection is present and antibiotics have been prescribed, take them as directed. Finish them even if you start to feel better.  Rest as needed.  Maintain an adequate fluid intake. To prevent dehydration during an illness with prolonged or recurrent fever, you may need to drink extra fluid.Drink enough fluids to keep your urine clear or pale yellow.  Sponging or bathing with room temperature water may help reduce body temperature. Do not use ice water or alcohol sponge baths.  Dress comfortably, but do not over-bundle. SEEK MEDICAL CARE IF:   You are unable to keep fluids down.  You develop vomiting or diarrhea.  You are not feeling at least partly better after 3 days.  You develop new symptoms or problems. SEEK IMMEDIATE MEDICAL CARE IF:   You have shortness of breath or trouble breathing.  You develop excessive weakness.  You are dizzy or you faint.  You are extremely thirsty or you are making little or no urine.  You develop new pain that was not there before (such as in the head, neck, chest, back, or abdomen).  You have persistant vomiting and diarrhea for more than 1 to 2 days.  You develop a stiff neck or your eyes become sensitive to light.  You develop a skin rash.  You have a fever or persistent symptoms for more than 2 to 3 days.  You have a fever and your symptoms suddenly get worse. MAKE SURE YOU:   Understand these instructions.  Will watch your condition.  Will get help right away if you are not  doing well or get worse. Document Released: 01/18/2001 Document Revised: 10/17/2011 Document Reviewed: 05/26/2011 [facility] Patient Information 2014 [place], [place].

## 2013-11-25 NOTE — Progress Notes (Signed)
Subjective:    Patient ID: Joseph Cherry, male    DOB: [birth year], 80 y.o.   MRN: [id number]  HPI Patient is a very nice 80 yo DWM with HTN, ASHD, PreDiabetes and Vit D Deficiency who presents with a feeling of malaise for 2-3 weeks and a 2 day Hx/o Feeling confused, disoriented and forgetful. He denies any focal neurological Sx's.     Medication List       This list is accurate as of: 11/25/13  9:17 PM.  Always use your most recent med list.               ALPRAZolam 1 MG tablet  Commonly known as:  XANAX  Take 1 tablet (1 mg total) by mouth 3 (three) times daily as needed for anxiety or sleep.     aspirin 81 MG chewable tablet  Chew by mouth daily.     azelastine 0.05 % ophthalmic solution  Commonly known as:  OPTIVAR     digoxin 0.25 MG tablet  Commonly known as:  LANOXIN  Take 1 tablet (0.25 mg total) by mouth daily.     finasteride 5 MG tablet  Commonly known as:  PROSCAR  Take 5 mg by mouth daily.     fluticasone 50 MCG/ACT nasal spray  Commonly known as:  FLONASE  Place 2 sprays into both nostrils 2 (two) times daily.     losartan 100 MG tablet  Commonly known as:  COZAAR  TAKE 1/2 TO 1 TABLET DAILY FOR BLOOD PRESSURE.     montelukast 10 MG tablet  Commonly known as:  SINGULAIR  Take 10 mg by mouth at bedtime.     MUCINEX ALLERGY PO  Take by mouth. PRN     ranitidine 300 MG tablet  Commonly known as:  ZANTAC  Take 1 tablet (300 mg total) by mouth at bedtime.     verapamil 240 MG CR tablet  Commonly known as:  CALAN-SR  TAKE 1 TABLET TWICE DAILY AFTER MEALS FOR HEART RHYTHM.     Vitamin D 2000 UNITS tablet  Take 2 tablets (4,000 Units total) by mouth daily.       Allergies  Allergen Reactions  . Demerol [Meperidine]   . Penicillins    Past Medical History  Diagnosis Date  . Hypertension   . Prediabetes   . Vitamin D deficiency   . ASHD (arteriosclerotic heart disease)    Review of Systems In addition to the HPI above,  No chills,  No  Headache, No changes with Vision or hearing,  No problems swallowing food or Liquids,  No Chest pain or productive Cough or Shortness of Breath,  No Abdominal pain, No Nausea or Vommitting No Blood in stool or Urine,  No dysuria,  No new skin rashes or bruises,  No new joints pains-aches,  No new weakness, tingling, numbness in any extremity,  No polyuria, polydypsia or polyphagia,  A full 10 point Review of Systems was done, except as stated above, all other Review of Systems were negative  Objective:   Physical Exam  BP 148/74  Pulse 108  Temp 99 F   Resp 16  Ht 6'   Wt 181 lb 12.8 oz   BMI 24.65 kg/m2   HEENT - Eac's patent. TM's Nl.EOM's full. PERRLA. NasoOroPharynx clear. Neck - supple. Nl Thyroid. No bruits nodes JVD Chest - Clear equal BS Cor - Nl HS. RRR w/o sig MGR. PP 1(+) No edema. Abd - No palpable organomegaly,  masses or tenderness. BS nl. MS- FROM. w/o deformities. Muscle power tone and bulk Nl. Gait Nl. Neuro - No obvious Cr N abnormalities. Sensory, motor and Cerebellar functions appear Nl w/o focal abnormalities.  Assessment & Plan:   1. Acute confusional state  2. Fever, unspecified - Urine Microscopic - Urine culture - CBC with Differential  3. Hypertension - TSH  4. Encounter for long-term (current) use of other medications - BASIC METABOLIC PANEL WITH GFR - Hepatic function panel  Advised daughter and granddaughter( CC[facility] nurse)  in attendance recommendations that he stay with one of the children for the next couple/few days til condition stabilizes.

## 2013-11-26 ENCOUNTER — Other Ambulatory Visit: Payer: Self-pay | Admitting: Internal Medicine

## 2013-11-26 DIAGNOSIS — I639 Cerebral infarction, unspecified: Secondary | ICD-10-CM

## 2013-11-26 LAB — URINALYSIS, MICROSCOPIC ONLY
BACTERIA UA: NONE SEEN
Casts: NONE SEEN
Crystals: NONE SEEN
Squamous Epithelial / LPF: NONE SEEN

## 2013-11-26 LAB — TSH: TSH: 1.874 u[IU]/mL (ref 0.350–4.500)

## 2013-11-27 ENCOUNTER — Other Ambulatory Visit: Payer: Self-pay | Admitting: Internal Medicine

## 2013-11-27 MED ORDER — CIPROFLOXACIN HCL 500 MG PO TABS: ORAL_TABLET | ORAL | Status: DC

## 2013-11-29 LAB — URINE CULTURE

## 2013-12-02 ENCOUNTER — Encounter: Payer: Self-pay | Admitting: Internal Medicine

## 2013-12-02 ENCOUNTER — Ambulatory Visit
Admission: RE | Admit: 2013-12-02 | Discharge: 2013-12-02 | Disposition: A | Discharge status: Home / Self Care | Payer: Medicare Other | Source: Ambulatory Visit | Attending: Internal Medicine | Admitting: Internal Medicine

## 2013-12-02 ENCOUNTER — Ambulatory Visit (INDEPENDENT_AMBULATORY_CARE_PROVIDER_SITE_OTHER): Payer: Medicare Other | Admitting: Internal Medicine

## 2013-12-02 VITALS — BP 134/76 | HR 72 | Temp 98.6°F | Resp 18 | Ht 72.0 in | Wt 183.4 lb

## 2013-12-02 DIAGNOSIS — I6350 Cerebral infarction due to unspecified occlusion or stenosis of unspecified cerebral artery: Secondary | ICD-10-CM | POA: Diagnosis not present

## 2013-12-02 DIAGNOSIS — I639 Cerebral infarction, unspecified: Secondary | ICD-10-CM

## 2013-12-02 DIAGNOSIS — Z8673 Personal history of transient ischemic attack (TIA), and cerebral infarction without residual deficits: Secondary | ICD-10-CM | POA: Insufficient documentation

## 2013-12-02 DIAGNOSIS — I2510 Atherosclerotic heart disease of native coronary artery without angina pectoris: Secondary | ICD-10-CM | POA: Diagnosis not present

## 2013-12-02 NOTE — Progress Notes (Signed)
Subjective:    Patient ID: Joseph Cherry, male    DOB: 04/09/1922, 80 y.o.   MRN: [id number]  HPI Patient presents for 1 week F/U for evaluation of confusion and low grade fever and was found to have a culture (+) Ecoli UTI and treated with Cipro. Because his symptoms persisted , a MRI was done today which showed a hemorrhagic CVA in the right Temporal and Occipital lobes. He does admit diffficulty seeing to his Left visual field. He denies any other neurologic symptoms.  Medication Sig  . ALPRAZolam (XANAX) 1 MG tablet Take 1 tablet (1 mg total) by mouth 3 (three) times daily as needed   . aspirin 81 MG chewable tablet Chew by mouth daily.  Marland Kitchen azelastine (OPTIVAR) 0.05 % ophthalmic solution   . Cholecalciferol (VITAMIN D) 2000 UNITS tablet Take 2 tablets (4,000 Units total) by mouth daily.  . ciprofloxacin (CIPRO) 500 MG tablet Take 1 tablet 2 x daily with meal for infection  . Fexofenadine HCl (MUCINEX ALLERGY PO) Take by mouth. PRN  . finasteride (PROSCAR) 5 MG tablet Take 5 mg by mouth daily.  . fluticasone (FLONASE) 50 MCG/ACT nasal spray Place 2 sprays into both nostrils 2 (two) times daily.  Marland Kitchen losartan (COZAAR) 100 MG tablet TAKE 1/2 TO 1 TABLET DAILY FOR BLOOD PRESSURE.  . montelukast (SINGULAIR) 10 MG tablet Take 10 mg by mouth at bedtime.  . ranitidine (ZANTAC) 300 MG tablet Take 1 tablet (300 mg total) by mouth at bedtime.  . verapamil (CALAN-SR) 240 MG CR tablet TAKE 1 TABLET TWICE DAILY AFTER MEALS FOR HEART RHYTHM.   Allergies  Allergen Reactions  . Demerol [Meperidine]   . Penicillins     Past Medical History  Diagnosis Date  . Hypertension   . Prediabetes   . Vitamin D deficiency   . ASHD (arteriosclerotic heart disease)    Review of Systems  In addition to the HPI above,  No Fever-chills,  No Headache, No changes with Vision or hearing,  No problems swallowing food or Liquids,  No Chest pain or productive Cough or Shortness of Breath,  No Abdominal pain, No  Nausea or Vomitting, Bowel movements are regular,  No Blood in stool or Urine,  No dysuria,  No new skin rashes or bruises,  No new joints pains-aches,  No new weakness, tingling, numbness in any extremity,  No recent weight loss,  No polyuria, polydypsia or polyphagia,  No significant Mental Stressors.  A full 10 point Review of Systems was done, except as stated above, all other Review of Systems were negative  Objective:   Physical Exam  BP 134/76  Pulse 72  Temp 98.6 F   Resp 18  Ht 6'   Wt 183 lb 6.4 oz   BMI 24.87 kg/m2  HEENT - Eac's patent. TM's Nl.EOM's full.  Patient does demonstrate a left  Homonymous  hemianopsia VF defect. PERRLA. NasoOroPharynx clear. Neck - supple. Nl Thyroid. No bruits nodes JVD Chest - Clear equal BS Cor - Nl HS. RRR w/o sig MGR. PP 1(+) No edema. Abd - No palpable organomegaly, masses or tenderness. BS nl. MS- FROM. w/o deformities. Muscle power tone and bulk Nl. Gait Nl. Neuro - No obvious Cr N abnormalities. Sensory, motor and Cerebellar functions appear Nl w/o focal abnormalities.  Assessment & Plan:   1. Subacute Hemorrhagic Right temporal and Occipital lobes CVA  Advised stop ASA at this time and report any new neurologic Sx's and has F/U here in  about 2 weeks.

## 2013-12-02 NOTE — Patient Instructions (Signed)
Stop Aspirin   Hemorrhagic in the Parietal & occipital area   Hemorrhagic Stroke A hemorrhagic stroke occurs when a blood vessel in the brain leaks or bursts. Areas of the brain that should receive blood, oxygen, and nutrients from the damaged blood vessel are deprived of blood flow. This causes areas of the brain to become damaged. Damage also occurs to areas of the brain where the leaked blood accumulates and presses on normal tissue. This is a medical emergency. This can cause permanent damage and loss of brain function. CAUSES  A hemorrhagic stroke is caused by a decrease of oxygen supply to an area of your brain. It is the result of a blood vessel that leaks or ruptures. The leaking or rupturing blood vessel occurs due to one of the following conditions:  A ballooning of a weak section in a blood vessel (aneurysm).  Hardened, thin blood vessels. Blood vessel walls lined with plaque becoming thin and hardened. These hardened, thin artery walls can crack open and allow blood to flow out of the blood vessel.  An abnormal formation of a blood vessel (arteriovenous malformation). This condition results in an abnormal tangle of thin-walled blood vessels. Once the blood vessel ruptures, bleeding occurs. The blood from the ruptured blood vessel accumulates and compresses the surrounding brain tissue. Hemorrhagic strokes are classified as to the location of the bleed. If the bleeding occurs within the brain tissue, the condition is called an intracerebral hemorrhage. If the bleeding occurs in the area between the brain and the thin tissues that cover the brain, the condition is called a subarachnoid hemorrhage.  RISK FACTORS  High blood pressure (hypertension).  Abnormal blood vessels present since birth.  Bleeding disorders, such as hemophilia, sickle cell disease, or liver disease.  The blood becoming too thin while taking blood thinners (anticoagulants).  Smoking.  Excessive alcohol  use.  Use of illegal drugs (especially cocaine and methamphetamine). SYMPTOMS   Sudden, severe headache with no known cause. The headache is often described as the worst headache ever experienced.  Nausea or vomiting, especially when combined with other symptoms such as a headache.  Sudden weakness or numbness of the face, arm, or leg, especially on one side of the body.  Sudden trouble walking or difficulty moving arms or legs.  Sudden confusion.  Sudden personality changes.  Trouble speaking (aphasia) or understanding.  Difficulty swallowing.  Sudden trouble seeing in one or both eyes.  Double vision.  Dizziness.  Loss of balance or coordination.  Intolerance to light.  Stiff neck. DIAGNOSIS  Your caregiver will often suspect a hemorrhagic stroke based on your symptoms, history, and exam. A CT scan of the brain is usually performed. This is done to confirm the presence of bleeding in the brain, to look for causes, and to determine severity. Other tests may be done, including:  An MRI.  Angiography.  Blood tests. TREATMENT  The goals of treatment are to try to stop the bleeding, control pressure in the brain, and relieve symptoms.  Medicines may be given to:  Lower blood pressure (antihypertensives).  Relieve pain (analgesics).  Relieve nausea or vomiting.  Stop or prevent seizures.  Prevent the blood vessels in the brain from going into spasm in response to the presence of bleeding.  Other medicines, blood products, or vitamin K may also be given to control the bleeding.  If there is a collection of blood putting pressure on your brain, or if the blood vessel continues to bleed, surgery may be  required.  Surgery may also be needed if tests reveal that there are other problems within the blood vessels of the brain that put you at an elevated risk for another bleeding event in the future. Further treatment depends on the duration, severity, and cause of  your symptoms. Physical, speech, and occupational therapists will assess you and work to improve any functions impaired by the stroke. Measures will be taken to prevent short-term and long-term complications, including infection from breathing foreign material into the lungs (aspiration pneumonia), blood clots in the legs, bedsores, and falls. HOME CARE INSTRUCTIONS   Be sure to take all your medicines exactly as instructed. Do not take any over-the-counter drugs or supplements without talking to your caregiver.  If swallow studies have determined that your swallowing reflex is present, you should eat healthy foods. A diet that includes 5 or more servings of fruits and vegetables each day may reduce the risk of stroke. Foods may need to be a special consistency (soft or pureed), or small bites may need to be taken in order to avoid aspirating or choking. Certain diets may be prescribed to address high blood pressure, high cholesterol, diabetes, or obesity.  A low salt (sodium), low saturated fat, low trans fat, low cholesterol diet is recommended to manage high blood pressure.  A low saturated fat, low trans fat, low cholesterol, and high fiber diet may control cholesterol levels.  A controlled carbohydrate, controlled sugar diet is recommended to manage diabetes.  A reduced calorie, low sodium, low saturated fat, low trans fat, low cholesterol diet is recommended to manage obesity.  Maintain a healthy weight.  Stay physically active. It is recommended that you get at least 30 minutes of activity on most or all days.  Do not smoke.  Limit alcohol use. Moderate alcohol use is considered to be:  No more than 2 drinks each day for men.  No more than 1 drink each day for nonpregnant women.  Stop drug abuse.  A safe home environment is important to reduce the risk of falls. Your caregiver may arrange for specialists to evaluate your home. Having grab bars in the bedroom and bathroom is often  important. Your caregiver may arrange for special equipment to be used at home, such as raised toilets and a seat for the shower.  Physical, occupational, and speech therapy. Ongoing therapy may be needed to maximize your recovery after a stroke. If you have been advised to use a walker or a cane, use it at all times. Be sure to keep your therapy appointments.  Follow all instructions for follow-up with your caregiver. This is very important. This includes any referrals, physical therapy, rehabilitation, and laboratory tests. Proper follow up can prevent another stroke from occurring. SEEK MEDICAL CARE IF:  You have personality changes.  You have difficulty swallowing.  You are seeing double.  You have dizziness.  You have a fever.  You have skin breakdown. SEEK IMMEDIATE MEDICAL CARE IF:   You have a sudden, severe headache with no known cause.  You have sudden nausea or vomiting with a severe headache.  You have sudden weakness or numbness of the face, arm, or leg, especially on one side of the body.  You have sudden trouble walking or difficulty moving arms or legs.  You have sudden confusion.  You have trouble speaking (aphasia) or understanding.  You have sudden trouble seeing in one or both eyes.  You have a sudden loss of balance or coordination.  You have  a stiff neck.  You have difficulty breathing.  You have a partial or total loss of consciousness. Any of these symptoms may represent a serious problem that is an emergency. Do not wait to see if the symptoms will go away. Get medical help at once. Call your local emergency services (911 in [place] Do not drive yourself to the hospital. Document Released: 01/12/2010 Document Revised: 03/27/2013 Document Reviewed: 03/04/2012 [facility] Patient Information 2014 [place].

## 2013-12-13 ENCOUNTER — Other Ambulatory Visit: Payer: Self-pay | Admitting: Internal Medicine

## 2013-12-20 ENCOUNTER — Ambulatory Visit (INDEPENDENT_AMBULATORY_CARE_PROVIDER_SITE_OTHER): Payer: Medicare Other | Admitting: Physician Assistant

## 2013-12-20 ENCOUNTER — Encounter: Payer: Self-pay | Admitting: Physician Assistant

## 2013-12-20 VITALS — BP 128/62 | HR 72 | Temp 97.9°F | Resp 16 | Wt 179.0 lb

## 2013-12-20 DIAGNOSIS — I6350 Cerebral infarction due to unspecified occlusion or stenosis of unspecified cerebral artery: Secondary | ICD-10-CM

## 2013-12-20 DIAGNOSIS — I2510 Atherosclerotic heart disease of native coronary artery without angina pectoris: Secondary | ICD-10-CM

## 2013-12-20 DIAGNOSIS — I639 Cerebral infarction, unspecified: Secondary | ICD-10-CM

## 2013-12-20 DIAGNOSIS — N3000 Acute cystitis without hematuria: Secondary | ICD-10-CM

## 2013-12-20 NOTE — Progress Notes (Signed)
HPI 80 y.o.male presents for follow up. He was seen in the office on 4/20 by Dr. Melford Aase., he was evaluated for confusion and found to have a + UTI and an MRI that showed a moderate sized stroke.  He was taken off his ASA. His son is here and states that his confusion is doing better, some depression, balance is better. Still some decreased vision in left eye but it has improved.   Past Medical History  Diagnosis Date  . Hypertension   . Prediabetes   . Vitamin D deficiency   . ASHD (arteriosclerotic heart disease)      Allergies  Allergen Reactions  . Demerol [Meperidine]   . Penicillins      Current Outpatient Prescriptions on File Prior to Visit  Medication Sig Dispense Refill  . finasteride (PROSCAR) 5 MG tablet Take 5 mg by mouth daily.      Marland Kitchen losartan (COZAAR) 100 MG tablet TAKE 1/2 TO 1 TABLET DAILY FOR BLOOD PRESSURE.  30 tablet  3  . montelukast (SINGULAIR) 10 MG tablet Take 10 mg by mouth at bedtime.      . ranitidine (ZANTAC) 300 MG tablet Take 1 tablet (300 mg total) by mouth at bedtime.  90 tablet  3  . verapamil (CALAN-SR) 240 MG CR tablet TAKE 1 TABLET TWICE DAILY AFTER MEALS FOR HEART RHYTHM.  60 tablet  3   No current facility-administered medications on file prior to visit.    ROS: all negative expect above.   Physical: Filed Weights   12/20/13 1002  Weight: 179 lb (81.194 kg)   BP 128/62  Pulse 72  Temp(Src) 97.9 F (36.6 C)  Resp 16  Wt 179 lb (81.194 kg) General Appearance: Well nourished, in no apparent distress. Eyes: PERRLA, EOMs, with decreased left temporal vision.  Sinuses: No Frontal/maxillary tenderness ENT/Mouth: Ext aud canals clear, normal light reflex with TMs without erythema, bulging. Post pharynx without erythema, swelling, exudate.  Respiratory: CTAB Cardio: RRR, no murmurs, rubs or gallops. Peripheral pulses brisk and equal bilaterally, without edema. No aortic or femoral bruits. Abdomen: Soft, with bowl sounds. Nontender, no  guarding, rebound. Lymphatics: Non tender without lymphadenopathy.  Musculoskeletal: Full ROM all peripheral extremities, 5/5 strength, and normal gait. Skin: Warm, dry without rashes, lesions, ecchymosis.  Neuro: Cranial nerves intact, reflexes equal bilaterally. Normal muscle tone, no cerebellar symptoms. Sensation intact.  Pysch: Awake and oriented X 3, normal affect, Insight and Judgment appropriate.   Assessment and Plan: 1. Subacute Hemorrhagic Right temporal and Occipital lobes CVA -symptoms are improving but patient is frustrated with not being able to drive with some questionable depression- will monitor closely.  - control BP -do not restart ASA at this time -report any new neurologic symptoms and follow up in 4 weeks.  2. UTI- repeat now

## 2013-12-20 NOTE — Patient Instructions (Signed)
1/2 of xanax at night for now  Hemorrhagic Stroke A hemorrhagic stroke occurs when a blood vessel in the brain leaks or bursts. Areas of the brain that should receive blood, oxygen, and nutrients from the damaged blood vessel are deprived of blood flow. This causes areas of the brain to become damaged. Damage also occurs to areas of the brain where the leaked blood accumulates and presses on normal tissue. This is a medical emergency. This can cause permanent damage and loss of brain function. CAUSES  A hemorrhagic stroke is caused by a decrease of oxygen supply to an area of your brain. It is the result of a blood vessel that leaks or ruptures. The leaking or rupturing blood vessel occurs due to one of the following conditions:  A ballooning of a weak section in a blood vessel (aneurysm).  Hardened, thin blood vessels. Blood vessel walls lined with plaque becoming thin and hardened. These hardened, thin artery walls can crack open and allow blood to flow out of the blood vessel.  An abnormal formation of a blood vessel (arteriovenous malformation). This condition results in an abnormal tangle of thin-walled blood vessels. Once the blood vessel ruptures, bleeding occurs. The blood from the ruptured blood vessel accumulates and compresses the surrounding brain tissue. Hemorrhagic strokes are classified as to the location of the bleed. If the bleeding occurs within the brain tissue, the condition is called an intracerebral hemorrhage. If the bleeding occurs in the area between the brain and the thin tissues that cover the brain, the condition is called a subarachnoid hemorrhage.  RISK FACTORS  High blood pressure (hypertension).  Abnormal blood vessels present since birth.  Bleeding disorders, such as hemophilia, sickle cell disease, or liver disease.  The blood becoming too thin while taking blood thinners (anticoagulants).  Smoking.  Excessive alcohol use.  Use of illegal drugs (especially  cocaine and methamphetamine). SYMPTOMS   Sudden, severe headache with no known cause. The headache is often described as the worst headache ever experienced.  Nausea or vomiting, especially when combined with other symptoms such as a headache.  Sudden weakness or numbness of the face, arm, or leg, especially on one side of the body.  Sudden trouble walking or difficulty moving arms or legs.  Sudden confusion.  Sudden personality changes.  Trouble speaking (aphasia) or understanding.  Difficulty swallowing.  Sudden trouble seeing in one or both eyes.  Double vision.  Dizziness.  Loss of balance or coordination.  Intolerance to light.  Stiff neck. DIAGNOSIS  Your caregiver will often suspect a hemorrhagic stroke based on your symptoms, history, and exam. A CT scan of the brain is usually performed. This is done to confirm the presence of bleeding in the brain, to look for causes, and to determine severity. Other tests may be done, including:  An MRI.  Angiography.  Blood tests. TREATMENT  The goals of treatment are to try to stop the bleeding, control pressure in the brain, and relieve symptoms.  Medicines may be given to:  Lower blood pressure (antihypertensives).  Relieve pain (analgesics).  Relieve nausea or vomiting.  Stop or prevent seizures.  Prevent the blood vessels in the brain from going into spasm in response to the presence of bleeding.  Other medicines, blood products, or vitamin K may also be given to control the bleeding.  If there is a collection of blood putting pressure on your brain, or if the blood vessel continues to bleed, surgery may be required.  Surgery may also  be needed if tests reveal that there are other problems within the blood vessels of the brain that put you at an elevated risk for another bleeding event in the future. Further treatment depends on the duration, severity, and cause of your symptoms. Physical, speech, and  occupational therapists will assess you and work to improve any functions impaired by the stroke. Measures will be taken to prevent short-term and long-term complications, including infection from breathing foreign material into the lungs (aspiration pneumonia), blood clots in the legs, bedsores, and falls. HOME CARE INSTRUCTIONS   Be sure to take all your medicines exactly as instructed. Do not take any over-the-counter drugs or supplements without talking to your caregiver.  If swallow studies have determined that your swallowing reflex is present, you should eat healthy foods. A diet that includes 5 or more servings of fruits and vegetables each day may reduce the risk of stroke. Foods may need to be a special consistency (soft or pureed), or small bites may need to be taken in order to avoid aspirating or choking. Certain diets may be prescribed to address high blood pressure, high cholesterol, diabetes, or obesity.  A low salt (sodium), low saturated fat, low trans fat, low cholesterol diet is recommended to manage high blood pressure.  A low saturated fat, low trans fat, low cholesterol, and high fiber diet may control cholesterol levels.  A controlled carbohydrate, controlled sugar diet is recommended to manage diabetes.  A reduced calorie, low sodium, low saturated fat, low trans fat, low cholesterol diet is recommended to manage obesity.  Maintain a healthy weight.  Stay physically active. It is recommended that you get at least 30 minutes of activity on most or all days.  Do not smoke.  Limit alcohol use. Moderate alcohol use is considered to be:  No more than 2 drinks each day for men.  No more than 1 drink each day for nonpregnant women.  Stop drug abuse.  A safe home environment is important to reduce the risk of falls. Your caregiver may arrange for specialists to evaluate your home. Having grab bars in the bedroom and bathroom is often important. Your caregiver may arrange  for special equipment to be used at home, such as raised toilets and a seat for the shower.  Physical, occupational, and speech therapy. Ongoing therapy may be needed to maximize your recovery after a stroke. If you have been advised to use a walker or a cane, use it at all times. Be sure to keep your therapy appointments.  Follow all instructions for follow-up with your caregiver. This is very important. This includes any referrals, physical therapy, rehabilitation, and laboratory tests. Proper follow up can prevent another stroke from occurring. SEEK MEDICAL CARE IF:  You have personality changes.  You have difficulty swallowing.  You are seeing double.  You have dizziness.  You have a fever.  You have skin breakdown. SEEK IMMEDIATE MEDICAL CARE IF:   You have a sudden, severe headache with no known cause.  You have sudden nausea or vomiting with a severe headache.  You have sudden weakness or numbness of the face, arm, or leg, especially on one side of the body.  You have sudden trouble walking or difficulty moving arms or legs.  You have sudden confusion.  You have trouble speaking (aphasia) or understanding.  You have sudden trouble seeing in one or both eyes.  You have a sudden loss of balance or coordination.  You have a stiff neck.  You  have difficulty breathing.  You have a partial or total loss of consciousness. Any of these symptoms may represent a serious problem that is an emergency. Do not wait to see if the symptoms will go away. Get medical help at once. Call your local emergency services (911 in [place] Do not drive yourself to the hospital. Document Released: 01/12/2010 Document Revised: 03/27/2013 Document Reviewed: 03/04/2012 [facility] Patient Information 2014 [place].

## 2013-12-21 LAB — URINALYSIS, ROUTINE W REFLEX MICROSCOPIC
BILIRUBIN URINE: NEGATIVE
GLUCOSE, UA: NEGATIVE mg/dL
Hgb urine dipstick: NEGATIVE
Leukocytes, UA: NEGATIVE
Nitrite: NEGATIVE
Protein, ur: NEGATIVE mg/dL
Specific Gravity, Urine: >1.03 — ABNORMAL HIGH (ref 1.005–1.030)
UROBILINOGEN UA: 0.2 mg/dL (ref 0.0–1.0)
pH: 5.5 (ref 5.0–8.0)

## 2013-12-22 LAB — URINE CULTURE
COLONY COUNT: NO GROWTH
ORGANISM ID, BACTERIA: NO GROWTH

## 2014-01-20 ENCOUNTER — Ambulatory Visit (INDEPENDENT_AMBULATORY_CARE_PROVIDER_SITE_OTHER): Payer: Medicare Other | Admitting: Physician Assistant

## 2014-01-20 ENCOUNTER — Encounter: Payer: Self-pay | Admitting: Physician Assistant

## 2014-01-20 VITALS — BP 102/60 | HR 60 | Temp 98.1°F | Resp 16 | Ht 72.0 in | Wt 181.0 lb

## 2014-01-20 DIAGNOSIS — R7303 Prediabetes: Secondary | ICD-10-CM

## 2014-01-20 DIAGNOSIS — Z9181 History of falling: Secondary | ICD-10-CM

## 2014-01-20 DIAGNOSIS — E559 Vitamin D deficiency, unspecified: Secondary | ICD-10-CM

## 2014-01-20 DIAGNOSIS — Z0000 Encounter for general adult medical examination without abnormal findings: Secondary | ICD-10-CM | POA: Diagnosis not present

## 2014-01-20 DIAGNOSIS — I10 Essential (primary) hypertension: Secondary | ICD-10-CM

## 2014-01-20 DIAGNOSIS — Z1331 Encounter for screening for depression: Secondary | ICD-10-CM

## 2014-01-20 DIAGNOSIS — N3000 Acute cystitis without hematuria: Secondary | ICD-10-CM

## 2014-01-20 DIAGNOSIS — E782 Mixed hyperlipidemia: Secondary | ICD-10-CM

## 2014-01-20 NOTE — Progress Notes (Signed)
MEDICARE ANNUAL WELLNESS VISIT AND FOLLOW UP Assessment:   1. Hypertension - continue medications, DASH diet, exercise and monitor at home. Call if greater than 130/80.   2. Prediabetes Discussed general issues about diabetes pathophysiology and management., Educational material distributed., Suggested low cholesterol diet., Encouraged aerobic exercise., Discussed foot care., Reminded to get yearly retinal exam.  3. Vitamin D deficiency Continue supplement  4. Hyperlipidemia Will check next visit  5. Prostatitis Finished cipro, no symptoms at this time.  - Urinalysis, Routine w reflex microscopic - Urine culture  6. Hemmoragic stroke -Stay of bASA for now, no fish oil, decrease drinking, no driving at this time due to eye sight, follow up with eye doctor  7. Depression? Patient is depressed with change in activity/restrictions, likely acute, no meds at this time.   8. Need for vaccinations States he would prefer to wait, needs Tetanus and Prevnar 13  Plan:   During the course of the visit the patient was educated and counseled about appropriate screening and preventive services including:    Pneumococcal vaccine   Influenza vaccine  Td vaccine  Screening electrocardiogram  Colorectal cancer screening  Diabetes screening  Glaucoma screening  Nutrition counseling   Screening recommendations, referrals: Vaccinations: Tdap vaccine declined Influenza vaccine up to date Pneumococcal vaccine up to date Shingles vaccine up to date Hep B vaccine not indicated  Nutrition assessed and recommended  Colonoscopy declined Recommended yearly ophthalmology/optometry visit for glaucoma screening and checkup Recommended yearly dental visit for hygiene and checkup Advanced directives - requested  Conditions/risks identified: BMI: Discussed weight loss, diet, and increase physical activity.  Increase physical activity: AHA recommends 150 minutes of physical activity a  week.  Medications reviewed Diabetes is at goal, ACE/ARB therapy: Yes. Urinary Incontinence is not an issue: discussed non pharmacology and pharmacology options.  Fall risk: moderate- discussed PT, home fall assessment, medications.    Subjective:  Joseph Cherry is a 80 y.o. male who presents for Medicare Annual Wellness Visit and 1 month follow up.  Date of last medicare wellness visit was is unknown.  His blood pressure has been controlled at home, today their BP is BP: 102/60 mmHg He does workout. He denies chest pain, shortness of breath, dizziness.  He is very active, he is not driving yet due to decreased peripheral vision on the left which has improved, he is due to see his eye doctor.   He has some depression due to decrease in freedom/restrictions but states that he continues to improve.    Names of Other Physician/Practitioners you currently use: 1. [facility] here for primary care 2. Dr. Herbert Deaner, eye doctor, last visit over due Patient Care Team: Unk Pinto, MD as PCP - General (Internal Medicine)  Medication Review: Current Outpatient Prescriptions on File Prior to Visit  Medication Sig Dispense Refill  . ALPRAZolam (XANAX) 1 MG tablet Take 1 mg by mouth daily.      . finasteride (PROSCAR) 5 MG tablet Take 5 mg by mouth daily.      Marland Kitchen losartan (COZAAR) 100 MG tablet TAKE 1/2 TO 1 TABLET DAILY FOR BLOOD PRESSURE.  30 tablet  3  . montelukast (SINGULAIR) 10 MG tablet Take 10 mg by mouth at bedtime.      . ranitidine (ZANTAC) 300 MG tablet Take 1 tablet (300 mg total) by mouth at bedtime.  90 tablet  3  . verapamil (CALAN-SR) 240 MG CR tablet TAKE 1 TABLET TWICE DAILY AFTER MEALS FOR HEART  RHYTHM.  60 tablet  3   No current facility-administered medications on file prior to visit.    Current Problems (verified) Patient Active Problem List   Diagnosis Date Noted  . CVA, Rt Temporal/Occipital Hemorrhagic (11/2013)  12/02/2013  .  Vitamin D deficiency 09/12/2013  . Hyperlipidemia 09/12/2013  . Encounter for long-term (current) use of other medications 09/12/2013  . Hypertension   . Prediabetes   . ASHD (arteriosclerotic heart disease)     Screening Tests Health Maintenance  Topic Date Due  . Colonoscopy  03/15/1974  . Pneumococcal Polysaccharide Vaccine Age 4 And Over  03/15/1989  . Tetanus/tdap  08/08/2013  . Influenza Vaccine  03/08/2014  . Zostavax  Completed    Immunization History  Administered Date(s) Administered  . Influenza Split 06/03/2013  . Td 08/09/2003  . Zoster 08/25/2010   Preventative care: Last colonoscopy: remote- will not have another  Prior vaccinations: TD or Tdap: 2005 DUE Influenza: 2014  Pneumococcal: 2003 Shingles/Zostavax: 2012  History reviewed: allergies, current medications, past family history, past medical history, past social history, past surgical history and problem list   Risk Factors: Tobacco History  Substance Use Topics  . Smoking status: Current Some Day Smoker    Types: Cigars  . Smokeless tobacco: Not on file  . Alcohol Use: Yes     Comment: occ   He does not smoke.  Patient is a former smoker. Are there smokers in your home (other than you)?  Yes  Alcohol Current alcohol use: social drinker  Caffeine Current caffeine use: coffee 2 /day  Exercise Current exercise: walking  Nutrition/Diet Current diet: in general, a "healthy" diet    Cardiac risk factors: advanced age (older than 22 for men, 4 for women), dyslipidemia, hypertension, male gender, sedentary lifestyle and smoking/ tobacco exposure.  Depression Screen (Note: if answer to either of the following is "Yes", a more complete depression screening is indicated)   Q1: Over the past two weeks, have you felt down, depressed or hopeless? Yes  Q2: Over the past two weeks, have you felt little interest or pleasure in doing things? Yes  Have you lost interest or pleasure in daily life?  No  Do you often feel hopeless? No  Do you cry easily over simple problems? No  Activities of Daily Living In your present state of health, do you have any difficulty performing the following activities?:  Driving? Yes Managing money?  No Feeding yourself? No Getting from bed to chair? No Climbing a flight of stairs? No Preparing food and eating?: No Bathing or showering? No Getting dressed: No Getting to the toilet? No Using the toilet:No Moving around from place to place: No In the past year have you fallen or had a near fall?:No   Are you sexually active?  No  Do you have more than one partner?  No  Vision Difficulties: Yes  Hearing Difficulties: No Do you often ask people to speak up or repeat themselves? No Do you experience ringing or noises in your ears? No Do you have difficulty understanding soft or whispered voices? No  Cognition  Do you feel that you have a problem with memory?Yes  Do you often misplace items? No  Do you feel safe at home?  Yes  Advanced directives Does patient have a [place]? Yes Does patient have a Living Will? Yes   Objective:   Blood pressure 102/60, pulse 60, temperature 98.1 F (36.7 C), resp. rate 16, height 6' (1.829  m), weight 181 lb (82.101 kg). Body mass index is 24.54 kg/(m^2).  General appearance: alert, no distress, WD/WN, male Cognitive Testing  Alert? Yes  Normal Appearance?Yes  Oriented to person? Yes  Place? Yes   Time? Yes  Recall of three objects?  Yes  Can perform simple calculations? Yes  Displays appropriate judgment?Yes  Can read the correct time from a watch face?Yes  General Appearance: Well nourished, in no apparent distress.  Eyes: PERRLA, EOMs, with decreased left temporal vision.  Sinuses: No Frontal/maxillary tenderness  ENT/Mouth: Ext aud canals clear, normal light reflex with TMs without erythema, bulging. Post pharynx without erythema, swelling, exudate.  Respiratory: CTAB   Cardio: RRR, no murmurs, rubs or gallops. Peripheral pulses brisk and equal bilaterally, without edema. No aortic or femoral bruits.  Abdomen: Soft, with bowl sounds. Nontender, no guarding, rebound.  Lymphatics: Non tender without lymphadenopathy.  Musculoskeletal: Full ROM all peripheral extremities, 5/5 strength, and normal gait.  Skin: Warm, dry without rashes, lesions, ecchymosis.  Neuro: Cranial nerves intact, reflexes equal bilaterally. Normal muscle tone, no cerebellar symptoms. Sensation intact.  Pysch: Awake and oriented X 3, normal affect, Insight and Judgment appropriate.    Medicare Attestation I have personally reviewed: The patient's medical and social history Their use of alcohol, tobacco or illicit drugs Their current medications and supplements The patient's functional ability including ADLs,fall risks, home safety risks, cognitive, and hearing and visual impairment Diet and physical activities Evidence for depression or mood disorders  The patient's weight, height, BMI, and visual acuity have been recorded in the chart.  I have made referrals, counseling, and provided education to the patient based on review of the above and I have provided the patient with a written personalized care plan for preventive services.     Vicie Mutters, PA-C   01/20/2014

## 2014-01-20 NOTE — Patient Instructions (Signed)
Preventative Care for Adults, Male       REGULAR HEALTH EXAMS:  A routine yearly physical is a good way to check in with your primary care provider about your health and preventive screening. It is also an opportunity to share updates about your health and any concerns you have, and receive a thorough all-over exam.   Most health insurance companies pay for at least some preventative services.  Check with your health plan for specific coverages.  WHAT PREVENTATIVE SERVICES DO MEN NEED?  Adult men should have their weight and blood pressure checked regularly.   Men age 35 and older should have their cholesterol levels checked regularly.  Beginning at age 50 and continuing to age 75, men should be screened for colorectal cancer.  Certain people should may need continued testing until age 85.  Other cancer screening may include exams for testicular and prostate cancer.  Updating vaccinations is part of preventative care.  Vaccinations help protect against diseases such as the flu.  Lab tests are generally done as part of preventative care to screen for anemia and blood disorders, to screen for problems with the kidneys and liver, to screen for bladder problems, to check blood sugar, and to check your cholesterol level.  Preventative services generally include counseling about diet, exercise, avoiding tobacco, drugs, excessive alcohol consumption, and sexually transmitted infections.    GENERAL RECOMMENDATIONS FOR GOOD HEALTH:  Healthy diet:  Eat a variety of foods, including fruit, vegetables, animal or vegetable protein, such as meat, fish, chicken, and eggs, or beans, lentils, tofu, and grains, such as rice.  Drink plenty of water daily.  Decrease saturated fat in the diet, avoid lots of red meat, processed foods, sweets, fast foods, and fried foods.  Exercise:  Aerobic exercise helps maintain good heart health. At least 30-40 minutes of moderate-intensity exercise is recommended.  For example, a brisk walk that increases your heart rate and breathing. This should be done on most days of the week.   Find a type of exercise or a variety of exercises that you enjoy so that it becomes a part of your daily life.  Examples are running, walking, swimming, water aerobics, and biking.  For motivation and support, explore group exercise such as aerobic class, spin class, Zumba, Yoga,or  martial arts, etc.    Set exercise goals for yourself, such as a certain weight goal, walk or run in a race such as a 5k walk/run.  Speak to your primary care provider about exercise goals.  Disease prevention:  If you smoke or chew tobacco, find out from your caregiver how to quit. It can literally save your life, no matter how long you have been a tobacco user. If you do not use tobacco, never begin.   Maintain a healthy diet and normal weight. Increased weight leads to problems with blood pressure and diabetes.   The Body Mass Index or BMI is a way of measuring how much of your body is fat. Having a BMI above 27 increases the risk of heart disease, diabetes, hypertension, stroke and other problems related to obesity. Your caregiver can help determine your BMI and based on it develop an exercise and dietary program to help you achieve or maintain this important measurement at a healthful level.  High blood pressure causes heart and blood vessel problems.  Persistent high blood pressure should be treated with medicine if weight loss and exercise do not work.   Fat and cholesterol leaves deposits in your arteries   that can block them. This causes heart disease and vessel disease elsewhere in your body.  If your cholesterol is found to be high, or if you have heart disease or certain other medical conditions, then you may need to have your cholesterol monitored frequently and be treated with medication.   Ask if you should have a stress test if your history suggests this. A stress test is a test done on  a treadmill that looks for heart disease. This test can find disease prior to there being a problem.  Avoid drinking alcohol in excess (more than two drinks per day).  Avoid use of street drugs. Do not share needles with anyone. Ask for professional help if you need assistance or instructions on stopping the use of alcohol, cigarettes, and/or drugs.  Brush your teeth twice a day with fluoride toothpaste, and floss once a day. Good oral hygiene prevents tooth decay and gum disease. The problems can be painful, unattractive, and can cause other health problems. Visit your dentist for a routine oral and dental check up and preventive care every 6-12 months.   Look at your skin regularly.  Use a mirror to look at your back. Notify your caregivers of changes in moles, especially if there are changes in shapes, colors, a size larger than a pencil eraser, an irregular border, or development of new moles.  Safety:  Use seatbelts 100% of the time, whether driving or as a passenger.  Use safety devices such as hearing protection if you work in environments with loud noise or significant background noise.  Use safety glasses when doing any work that could send debris in to the eyes.  Use a helmet if you ride a bike or motorcycle.  Use appropriate safety gear for contact sports.  Talk to your caregiver about gun safety.  Use sunscreen with a SPF (or skin protection factor) of 15 or greater.  Lighter skinned people are at a greater risk of skin cancer. Don't forget to also wear sunglasses in order to protect your eyes from too much damaging sunlight. Damaging sunlight can accelerate cataract formation.   Practice safe sex. Use condoms. Condoms are used for birth control and to help reduce the spread of sexually transmitted infections (or STIs).  Some of the STIs are gonorrhea (the clap), chlamydia, syphilis, trichomonas, herpes, HPV (human papilloma virus) and HIV (human immunodeficiency virus) which causes AIDS.  The herpes, HIV and HPV are viral illnesses that have no cure. These can result in disability, cancer and death.   Keep carbon monoxide and smoke detectors in your home functioning at all times. Change the batteries every 6 months or use a model that plugs into the wall.   Vaccinations:  Stay up to date with your tetanus shots and other required immunizations. You should have a booster for tetanus every 10 years. Be sure to get your flu shot every year, since 5%-20% of the [place] population comes down with the flu. The flu vaccine changes each year, so being vaccinated once is not enough. Get your shot in the fall, before the flu season peaks.   Other vaccines to consider:  Pneumococcal vaccine to protect against certain types of pneumonia.  This is normally recommended for adults age 65 or older.  However, adults younger than 78 years old with certain underlying conditions such as diabetes, heart or lung disease should also receive the vaccine.  Shingles vaccine to protect against Varicella Zoster if you are older than age 60, or younger   than 78 years old with certain underlying illness.  Hepatitis A vaccine to protect against a form of infection of the liver by a virus acquired from food.  Hepatitis B vaccine to protect against a form of infection of the liver by a virus acquired from blood or body fluids, particularly if you work in health care.  If you plan to travel internationally, check with your local health department for specific vaccination recommendations.  Cancer Screening:  Most routine colon cancer screening begins at the age of 50. On a yearly basis, doctors may provide special easy to use take-home tests to check for hidden blood in the stool. Sigmoidoscopy or colonoscopy can detect the earliest forms of colon cancer and is life saving. These tests use a small camera at the end of a tube to directly examine the colon. Speak to your caregiver about this at age 50, when routine  screening begins (and is repeated every 5 years unless early forms of pre-cancerous polyps or small growths are found).   At the age of 50 men usually start screening for prostate cancer every year. Screening may begin at a younger age for those with higher risk. Those at higher risk include African-Americans or having a family history of prostate cancer. There are two types of tests for prostate cancer:   Prostate-specific antigen (PSA) testing. Recent studies raise questions about prostate cancer using PSA and you should discuss this with your caregiver.   Digital rectal exam (in which your doctor's lubricated and gloved finger feels for enlargement of the prostate through the anus).   Screening for testicular cancer.  Do a monthly exam of your testicles. Gently roll each testicle between your thumb and fingers, feeling for any abnormal lumps. The best time to do this is after a hot shower or bath when the tissues are looser. Notify your caregivers of any lumps, tenderness or changes in size or shape immediately.     

## 2014-01-21 LAB — URINALYSIS, ROUTINE W REFLEX MICROSCOPIC
Glucose, UA: NEGATIVE mg/dL
Hgb urine dipstick: NEGATIVE
LEUKOCYTES UA: NEGATIVE
Nitrite: NEGATIVE
PH: 5.5 (ref 5.0–8.0)
Protein, ur: NEGATIVE mg/dL
Specific Gravity, Urine: 1.024 (ref 1.005–1.030)
UROBILINOGEN UA: 0.2 mg/dL (ref 0.0–1.0)

## 2014-01-21 LAB — URINE CULTURE
COLONY COUNT: NO GROWTH
ORGANISM ID, BACTERIA: NO GROWTH

## 2014-03-03 ENCOUNTER — Other Ambulatory Visit: Payer: Self-pay | Admitting: Physician Assistant

## 2014-03-03 MED ORDER — ALPRAZOLAM 1 MG PO TABS: 1.0000 mg | ORAL_TABLET | Freq: Every day | ORAL | Status: DC

## 2014-03-14 ENCOUNTER — Other Ambulatory Visit: Payer: Self-pay | Admitting: Internal Medicine

## 2014-03-23 ENCOUNTER — Encounter: Payer: Self-pay | Admitting: Internal Medicine

## 2014-03-23 NOTE — Progress Notes (Signed)
Patient ID: Joseph Cherry, male   DOB: [birth year], 80 y.o.   MRN: [id number]   This very nice 80 y.o.DWM presents for 3 month follow up with Hypertension, Hyperlipidemia, Pre-Diabetes and Vitamin D Deficiency. In April , patient had a hemorrhagic Right Occipitotemporal CVA w/ left sided field cuts. He still is not driving.    Patient is treated for HTN & BP has been controlled at home. Today's BP: 114/58 mmHg. Patient denies any cardiac type chest pain, palpitations, dyspnea/orthopnea/PND, dizziness, claudication, or dependent edema.   Hyperlipidemia is controlled with diet & meds. Patient denies myalgias or other med SE's. Last Lipids were  Cholesterol 144; HDL Cholesterol 43; LDL  66; Triglycerides 175 on  09/12/2013.   Also, the patient has history of PreDiabetes and patient denies any symptoms of reactive hypoglycemia, diabetic polys, paresthesias or visual blurring.  Last A1c was 5.1% on  09/12/2013.    Further, Patient has history of Vitamin D Deficiency and patient supplements vitamin D without any suspected side-effects. Last vitamin D was  Vit D, 25-Hydroxy 75 on  09/12/2013.   Medication List   ALPRAZolam 1 MG tablet  Commonly known as:  XANAX  Take 1 tablet (1 mg total) by mouth daily.     finasteride 5 MG tablet  Commonly known as:  PROSCAR  Take 5 mg by mouth daily.     losartan 100 MG tablet  Commonly known as:  COZAAR  TAKE 1/2 TO 1 TABLET DAILY FOR BLOOD PRESSURE.     montelukast 10 MG tablet  Commonly known as:  SINGULAIR  TAKE 1 TABLET ONCE A DAY FOR ALLERGIES.     ranitidine 300 MG tablet  Commonly known as:  ZANTAC  Take 1 tablet (300 mg total) by mouth at bedtime.     verapamil 240 MG CR tablet  Commonly known as:  CALAN-SR  TAKE 1 TABLET TWICE DAILY AFTER MEALS FOR HEART RHYTHM.       Allergies  Allergen Reactions  . Demerol [Meperidine]   . Penicillins    PMHx:   Past Medical History  Diagnosis Date  . Hypertension   . Prediabetes   . Vitamin D  deficiency   . ASHD (arteriosclerotic heart disease)    FHx:    Reviewed / unchanged SHx:    Reviewed / unchanged  Systems Review:  Constitutional: Denies fever, chills, wt changes, headaches, insomnia, fatigue, night sweats, change in appetite. Eyes: Denies redness, blurred vision, diplopia, discharge, itchy, watery eyes.  ENT: Denies discharge, congestion, post nasal drip, epistaxis, sore throat, earache, hearing loss, dental pain, tinnitus, vertigo, sinus pain, snoring.  CV: Denies chest pain, palpitations, irregular heartbeat, syncope, dyspnea, diaphoresis, orthopnea, PND, claudication or edema. Respiratory: denies cough, dyspnea, DOE, pleurisy, hoarseness, laryngitis, wheezing.  Gastrointestinal: Denies dysphagia, odynophagia, heartburn, reflux, water brash, abdominal pain or cramps, nausea, vomiting, bloating, diarrhea, constipation, hematemesis, melena, hematochezia  or hemorrhoids. Genitourinary: Denies dysuria, frequency, urgency, nocturia, hesitancy, discharge, hematuria or flank pain. Musculoskeletal: Denies arthralgias, myalgias, stiffness, jt. swelling, pain, limping or strain/sprain.  Skin: Denies pruritus, rash, hives, warts, acne, eczema or change in skin lesion(s). Neuro: No weakness, tremor, incoordination, spasms, paresthesia or pain. Psychiatric: Denies confusion, memory loss or sensory loss. Endo: Denies change in weight, skin or hair change.  Heme/Lymph: No excessive bleeding, bruising or enlarged lymph nodes.  Exam:  BP 114/58      P 64    T   98.8 F     R 16  Ht 6'      Wt 184 lb      BMI 24.95 kg/m2  Appears well nourished and in no distress. Eyes: Left HH. PERRLA, EOMs, conjunctiva no swelling or erythema. Sinuses: No frontal/maxillary tenderness ENT/Mouth: EAC's clear, TM's nl w/o erythema, bulging. Nares clear w/o erythema, swelling, exudates. Oropharynx clear without erythema or exudates. Oral hygiene is good. Tongue normal, non obstructing. Hearing  intact.  Neck: Supple. Thyroid nl. Car 2+/2+ without bruits, nodes or JVD. Chest: Respirations nl with BS clear & equal w/o rales, rhonchi, wheezing or stridor.  Cor: Heart sounds normal w/ regular rate and rhythm without sig. murmurs, gallops, clicks, or rubs. Peripheral pulses normal and equal  without edema.  Abdomen: Soft & bowel sounds normal. Non-tender w/o guarding, rebound, hernias, masses, or organomegaly.  Lymphatics: Unremarkable.  Musculoskeletal: Full ROM all peripheral extremities, joint stability, 5/5 strength, and normal gait.  Skin: Warm, dry without exposed rashes, lesions or ecchymosis apparent.  Neuro: Cranial nerves intact, reflexes equal bilaterally. Sensory-motor testing grossly intact. Tendon reflexes grossly intact.  Pysch: Alert & oriented x 3. Insight and judgement nl & appropriate. No ideations.  Assessment and Plan:  1. Hypertension - Continue monitor blood pressure at home. Continue diet/meds same.  2. Hyperlipidemia - Continue diet/meds, exercise,& lifestyle modifications. Continue monitor periodic cholesterol/liver & renal functions   3. Pre-Diabetes - Continue diet, exercise, lifestyle modifications. Monitor appropriate labs.  3. T2_NIDDM -    4. Vitamin D Deficiency - Continue supplementation.  5. ASCVD with hx/o Rt Brain CVA  Recommended regular exercise, BP monitoring, weight control, and discussed med and SE's. Recommended labs to assess and monitor clinical status. Further disposition pending results of labs.  Schedule to see Dr Herbert Deaner for VF evaluation. Also, offered to arrange Phys. Therapy eval of driving ability/safety.

## 2014-03-23 NOTE — Patient Instructions (Signed)

## 2014-03-24 ENCOUNTER — Encounter: Payer: Self-pay | Admitting: Internal Medicine

## 2014-03-24 ENCOUNTER — Ambulatory Visit (INDEPENDENT_AMBULATORY_CARE_PROVIDER_SITE_OTHER): Payer: Medicare Other | Admitting: Internal Medicine

## 2014-03-24 VITALS — BP 114/58 | HR 64 | Temp 98.8°F | Resp 16 | Ht 72.0 in | Wt 184.0 lb

## 2014-03-24 DIAGNOSIS — E782 Mixed hyperlipidemia: Secondary | ICD-10-CM

## 2014-03-24 DIAGNOSIS — Z79899 Other long term (current) drug therapy: Secondary | ICD-10-CM

## 2014-03-24 DIAGNOSIS — E559 Vitamin D deficiency, unspecified: Secondary | ICD-10-CM | POA: Diagnosis not present

## 2014-03-24 DIAGNOSIS — R7303 Prediabetes: Secondary | ICD-10-CM

## 2014-03-24 DIAGNOSIS — I10 Essential (primary) hypertension: Secondary | ICD-10-CM | POA: Diagnosis not present

## 2014-03-24 DIAGNOSIS — R7309 Other abnormal glucose: Secondary | ICD-10-CM

## 2014-03-24 LAB — HEMOGLOBIN A1C
Hgb A1c MFr Bld: 5.3 % (ref <5.7)
Mean Plasma Glucose: 105 mg/dL (ref <117)

## 2014-03-24 LAB — CBC WITH DIFFERENTIAL/PLATELET
BASOS ABS: 0 10*3/uL (ref 0.0–0.1)
BASOS PCT: 0 % (ref 0–1)
Eosinophils Absolute: 0.1 10*3/uL (ref 0.0–0.7)
Eosinophils Relative: 2 % (ref 0–5)
HEMATOCRIT: 38.5 % — AB (ref 39.0–52.0)
Hemoglobin: 13.3 g/dL (ref 13.0–17.0)
LYMPHS PCT: 27 % (ref 12–46)
Lymphs Abs: 1.8 10*3/uL (ref 0.7–4.0)
MCH: 32 pg (ref 26.0–34.0)
MCHC: 34.5 g/dL (ref 30.0–36.0)
MCV: 92.5 fL (ref 78.0–100.0)
Monocytes Absolute: 0.9 10*3/uL (ref 0.1–1.0)
Monocytes Relative: 13 % — ABNORMAL HIGH (ref 3–12)
NEUTROS ABS: 3.9 10*3/uL (ref 1.7–7.7)
Neutrophils Relative %: 58 % (ref 43–77)
PLATELETS: 236 10*3/uL (ref 150–400)
RBC: 4.16 MIL/uL — ABNORMAL LOW (ref 4.22–5.81)
RDW: 13.2 % (ref 11.5–15.5)
WBC: 6.8 10*3/uL (ref 4.0–10.5)

## 2014-03-25 LAB — TSH: TSH: 2.619 u[IU]/mL (ref 0.350–4.500)

## 2014-03-25 LAB — MAGNESIUM: Magnesium: 2.1 mg/dL (ref 1.5–2.5)

## 2014-03-25 LAB — BASIC METABOLIC PANEL WITH GFR
BUN: 15 mg/dL (ref 6–23)
CO2: 27 mEq/L (ref 19–32)
CREATININE: 1.12 mg/dL (ref 0.50–1.35)
Calcium: 9.2 mg/dL (ref 8.4–10.5)
Chloride: 105 mEq/L (ref 96–112)
GFR, EST NON AFRICAN AMERICAN: 58 mL/min — AB
GFR, Est African American: 66 mL/min
Glucose, Bld: 93 mg/dL (ref 70–99)
POTASSIUM: 4.7 meq/L (ref 3.5–5.3)
Sodium: 138 mEq/L (ref 135–145)

## 2014-03-25 LAB — LIPID PANEL
CHOLESTEROL: 151 mg/dL (ref 0–200)
HDL: 44 mg/dL (ref >39)
LDL CALC: 78 mg/dL (ref 0–99)
TRIGLYCERIDES: 144 mg/dL (ref <150)
Total CHOL/HDL Ratio: 3.4 Ratio
VLDL: 29 mg/dL (ref 0–40)

## 2014-03-25 LAB — HEPATIC FUNCTION PANEL
ALT: 9 U/L (ref 0–53)
AST: 13 U/L (ref 0–37)
Albumin: 3.7 g/dL (ref 3.5–5.2)
Alkaline Phosphatase: 68 U/L (ref 39–117)
BILIRUBIN INDIRECT: 0.4 mg/dL (ref 0.2–1.2)
BILIRUBIN TOTAL: 0.5 mg/dL (ref 0.2–1.2)
Bilirubin, Direct: 0.1 mg/dL (ref 0.0–0.3)
TOTAL PROTEIN: 6.4 g/dL (ref 6.0–8.3)

## 2014-03-25 LAB — INSULIN, FASTING: INSULIN FASTING, SERUM: 53 u[IU]/mL — AB (ref 3–28)

## 2014-03-25 LAB — VITAMIN D 25 HYDROXY (VIT D DEFICIENCY, FRACTURES): VIT D 25 HYDROXY: 66 ng/mL (ref 30–89)

## 2014-04-09 IMAGING — RF DG UGI W/ SMALL BOWEL
18 of 24 series · 18 of 24 positions shown · non-contrast
Comparison: None.

CLINICAL DATA: Blood in stool

UPPER GI W/ SMALL BOWEL
TECHNIQUE: Upper GI series performed with high density barium and
effervescent agent. Thin barium also used.  Subsequently, serial
images of the small bowel were obtained including spot views of the
terminal ileum.
Fluoroscopy Time: 3.0-minute

[Series 1: run · 1 of 1 slices shown (1 of 18)]
[im 1/1]
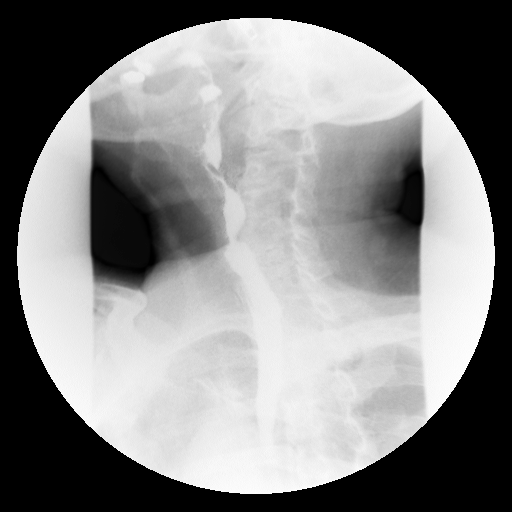

[Series 3: run · 1 of 1 slices shown (2 of 18)]
[im 1/1]
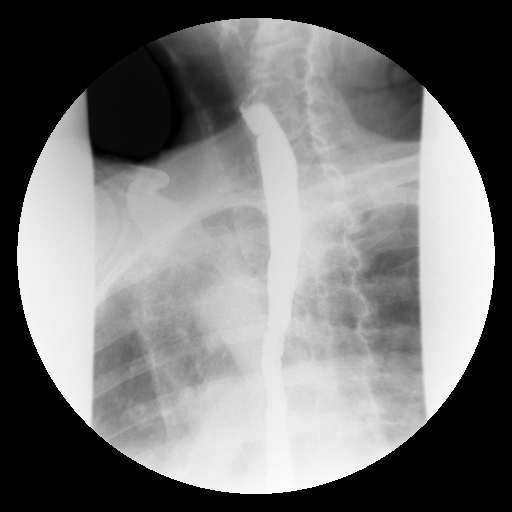

[Series 4: run · 1 of 1 slices shown (3 of 18)]
[im 1/1]
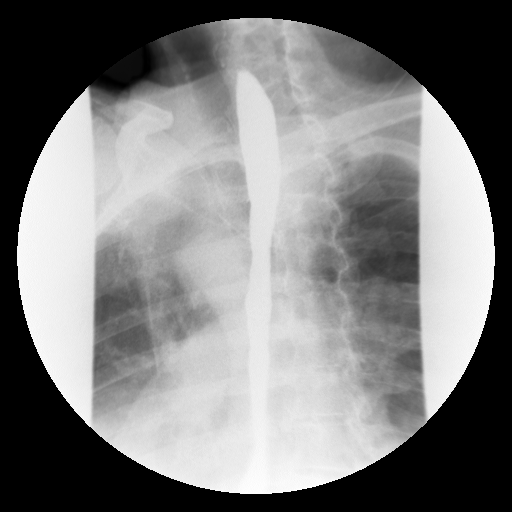

[Series 5: run · 1 of 1 slices shown (4 of 18)]
[im 1/1]
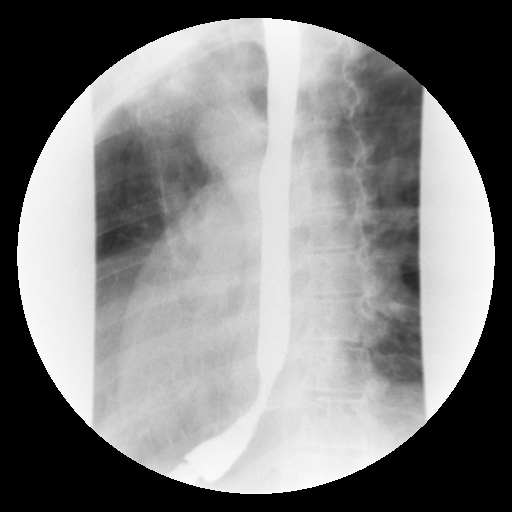

[Series 7: run · 1 of 1 slices shown (5 of 18)]
[im 1/1]
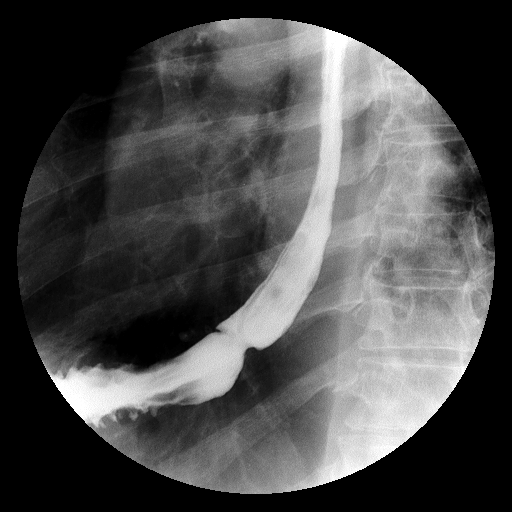

[Series 8: run · 1 of 1 slices shown (6 of 18)]
[im 1/1]
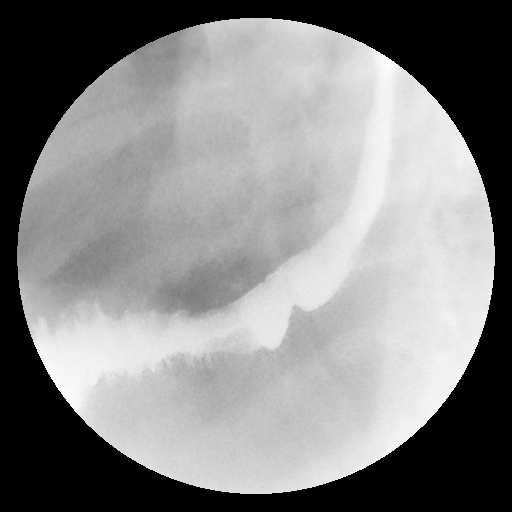

[Series 9: run · 1 of 1 slices shown (7 of 18)]
[im 1/1]
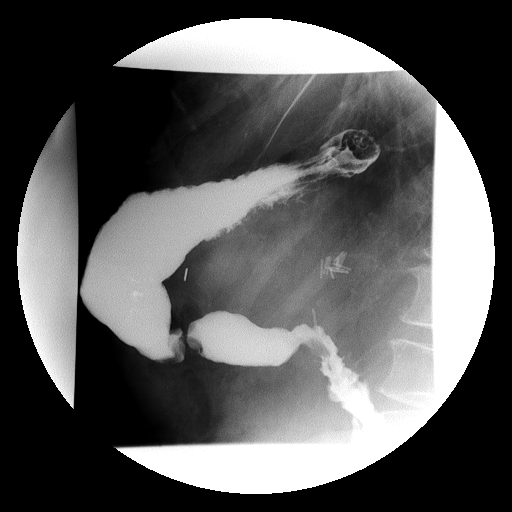

[Series 11: run · 1 of 1 slices shown (8 of 18)]
[im 1/1]
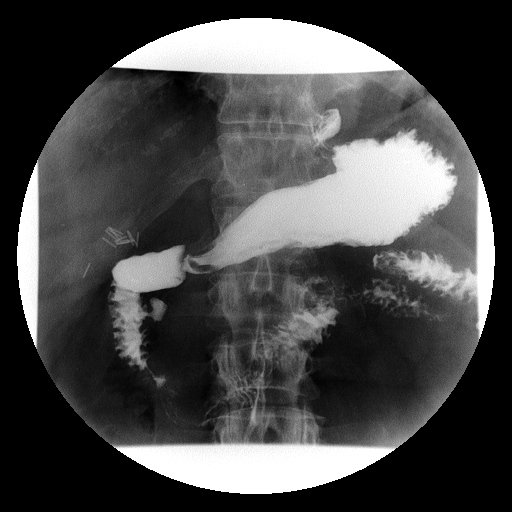

[Series 12: run · 1 of 1 slices shown (9 of 18)]
[im 1/1]
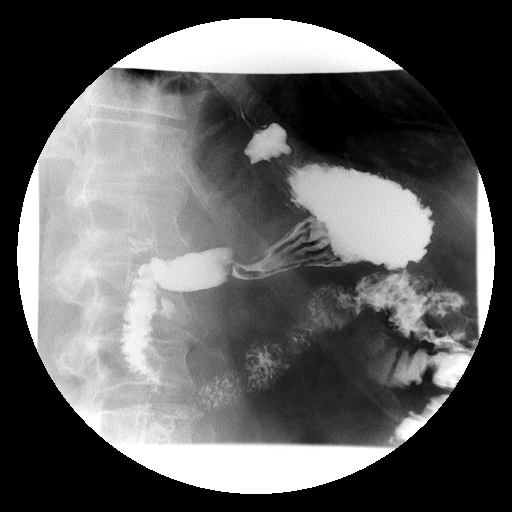

[Series 13: run · 1 of 1 slices shown (10 of 18)]
[im 1/1]
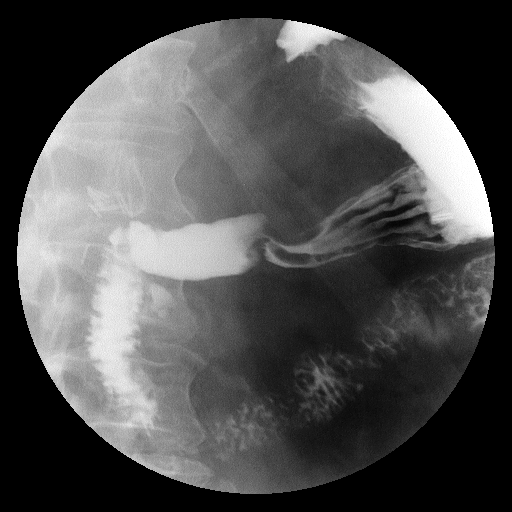

[Series 15: run · 1 of 1 slices shown (11 of 18)]
[im 1/1]
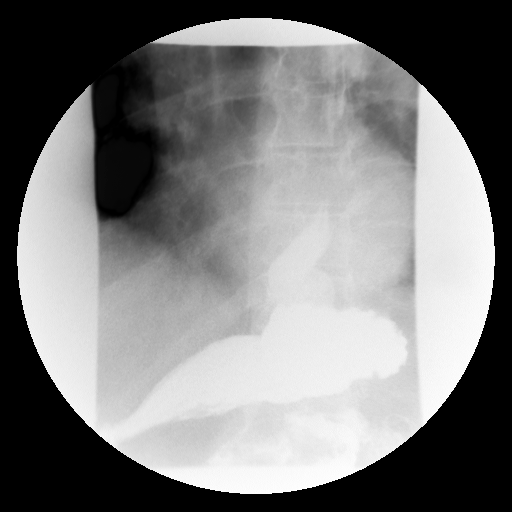

[Series 16: run · 1 of 1 slices shown (12 of 18)]
[im 1/1]
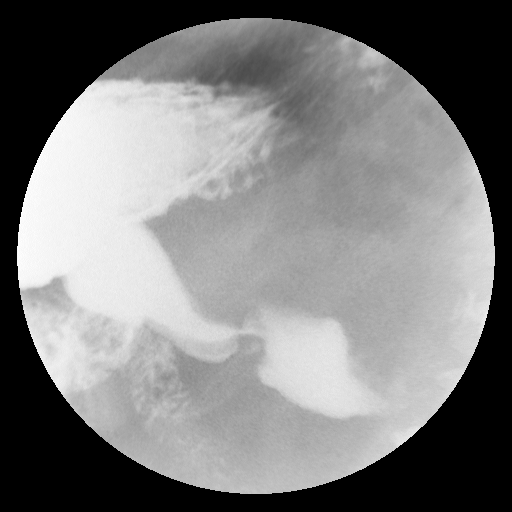

[Series 17: run · 1 of 1 slices shown (13 of 18)]
[im 1/1]
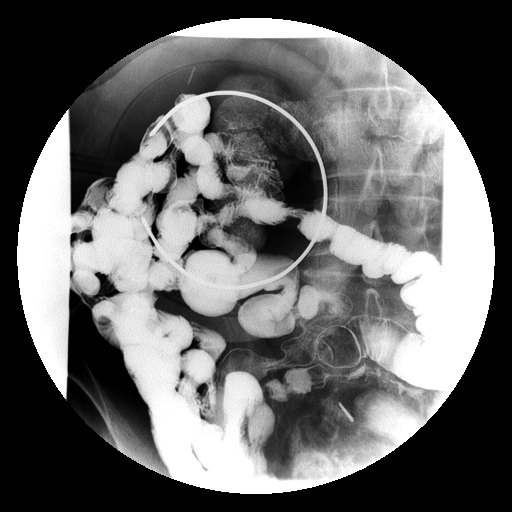

[Series 19: run · 1 of 1 slices shown (14 of 18)]
[im 1/1]
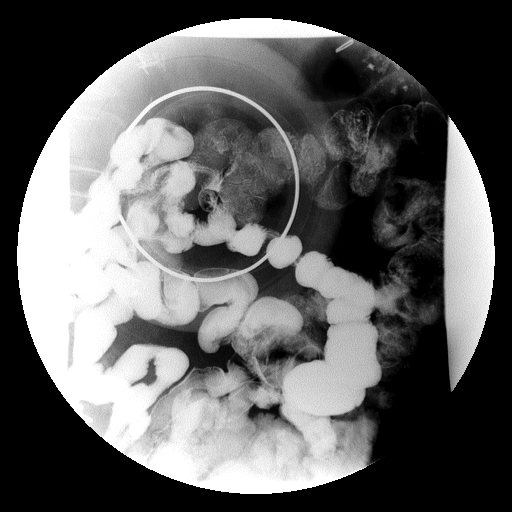

[Series 20: run · 1 of 1 slices shown (15 of 18)]
[im 1/1]
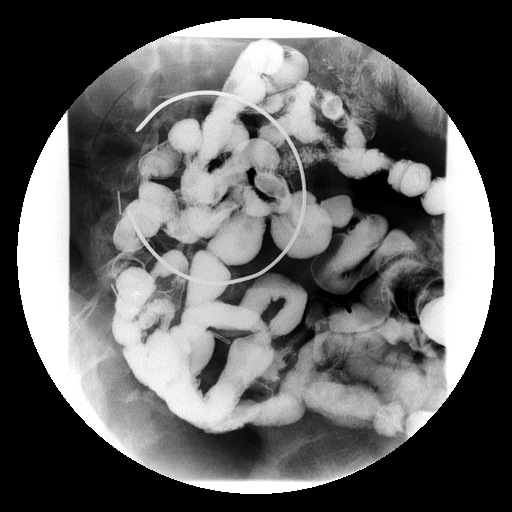

[Series 21: run · 1 of 1 slices shown (16 of 18)]
[im 1/1]
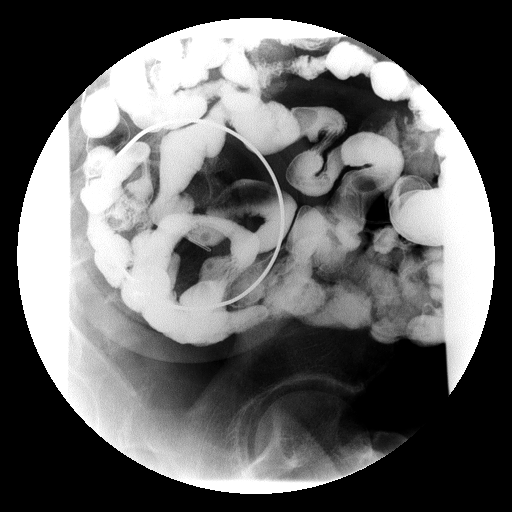

[Series 23: run · 1 of 1 slices shown (17 of 18)]
[im 1/1]
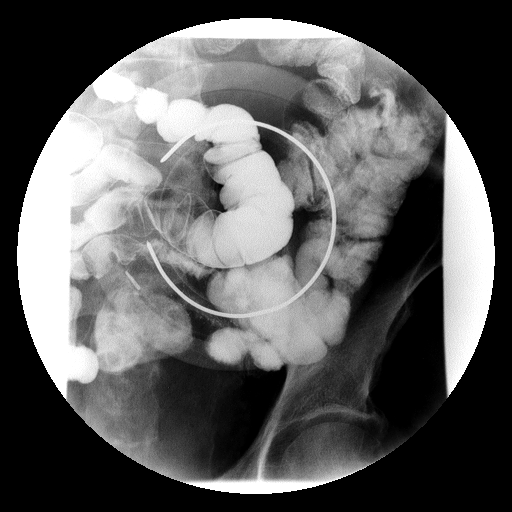

[Series 24: run · 1 of 1 slices shown (18 of 18)]
[im 1/1]
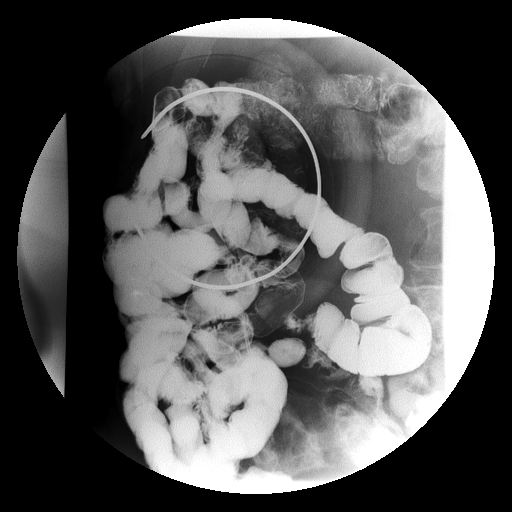

[18 of 24 positions shown; findings below may reference images not displayed]

FINDINGS: A preliminary film of the abdomen shows a nonspecific
bowel gas pattern.  There are surgical clips in the right upper
quadrant, left upper quadrant, and right lower quadrant.  No opaque
calculi are seen.

A single contrast upper GI was performed.  There is some prominence
of the cricopharyngeus muscle noted.  Otherwise the swallowing
mechanism is normal.  There is a small hiatal hernia present.
There is mild to moderate gastroesophageal reflux demonstrated.
Tertiary contractions also are noted in the mid and distal thoracic
esophagus.

The stomach is normal in contour and peristalsis.  There is a
prominent gastric rugae in the antrum of the stomach of
questionable significance possibly indicating mild edema as with
antritis.  However no ulceration is seen.  The duodenal bulb fills
and the duodenal loop is in normal position.  A small descending
duodenal diverticulum is present.

The patient was given additional barium orally and images of the
small bowel were obtained.  The patient has previously had partial
colectomy.  The small bowel is normal in caliber with no edema or
displacement.  The anastomosis of small bowel with proximal
transverse colon is overlain by small bowel loops but the small
bowel is unremarkable under fluoroscopy and compression.
IMPRESSION: 1.  Small hiatal hernia with mild to moderate gastroesophageal
reflux and prominent cricopharyngeus muscle.
2.  Moderate tertiary contractions in the mid and distal esophagus.
3.  Somewhat prominent gastric rugae in the antrum of the stomach
may indicate edema as with antritis.  No ulceration is seen.
4.  Negative small-bowel follow-through.  Prior right
hemicolectomy.

## 2014-04-10 DIAGNOSIS — H5340 Unspecified visual field defects: Secondary | ICD-10-CM | POA: Diagnosis not present

## 2014-04-10 DIAGNOSIS — H40019 Open angle with borderline findings, low risk, unspecified eye: Secondary | ICD-10-CM | POA: Diagnosis not present

## 2014-04-10 DIAGNOSIS — H0019 Chalazion unspecified eye, unspecified eyelid: Secondary | ICD-10-CM | POA: Diagnosis not present

## 2014-04-10 DIAGNOSIS — H01009 Unspecified blepharitis unspecified eye, unspecified eyelid: Secondary | ICD-10-CM | POA: Diagnosis not present

## 2014-04-10 DIAGNOSIS — H353190 Nonexudative age-related macular degeneration, unspecified eye, stage unspecified: Secondary | ICD-10-CM | POA: Diagnosis not present

## 2014-04-16 ENCOUNTER — Other Ambulatory Visit: Payer: Self-pay | Admitting: Internal Medicine

## 2014-05-01 ENCOUNTER — Other Ambulatory Visit: Payer: Self-pay | Admitting: Dermatology

## 2014-05-01 DIAGNOSIS — C44519 Basal cell carcinoma of skin of other part of trunk: Secondary | ICD-10-CM | POA: Diagnosis not present

## 2014-05-01 DIAGNOSIS — C44721 Squamous cell carcinoma of skin of unspecified lower limb, including hip: Secondary | ICD-10-CM | POA: Diagnosis not present

## 2014-05-01 DIAGNOSIS — L570 Actinic keratosis: Secondary | ICD-10-CM | POA: Diagnosis not present

## 2014-05-01 DIAGNOSIS — Z85820 Personal history of malignant melanoma of skin: Secondary | ICD-10-CM | POA: Diagnosis not present

## 2014-05-01 DIAGNOSIS — L821 Other seborrheic keratosis: Secondary | ICD-10-CM | POA: Diagnosis not present

## 2014-05-01 DIAGNOSIS — L820 Inflamed seborrheic keratosis: Secondary | ICD-10-CM | POA: Diagnosis not present

## 2014-05-01 DIAGNOSIS — D485 Neoplasm of uncertain behavior of skin: Secondary | ICD-10-CM | POA: Diagnosis not present

## 2014-05-01 DIAGNOSIS — Z85828 Personal history of other malignant neoplasm of skin: Secondary | ICD-10-CM | POA: Diagnosis not present

## 2014-05-20 ENCOUNTER — Ambulatory Visit (INDEPENDENT_AMBULATORY_CARE_PROVIDER_SITE_OTHER): Payer: Medicare Other | Admitting: *Deleted

## 2014-05-20 DIAGNOSIS — Z23 Encounter for immunization: Secondary | ICD-10-CM | POA: Diagnosis not present

## 2014-05-26 DIAGNOSIS — C61 Malignant neoplasm of prostate: Secondary | ICD-10-CM | POA: Diagnosis not present

## 2014-05-26 DIAGNOSIS — R351 Nocturia: Secondary | ICD-10-CM | POA: Diagnosis not present

## 2014-05-26 DIAGNOSIS — R3913 Splitting of urinary stream: Secondary | ICD-10-CM | POA: Diagnosis not present

## 2014-06-10 ENCOUNTER — Other Ambulatory Visit: Payer: Self-pay | Admitting: Physician Assistant

## 2014-06-12 ENCOUNTER — Other Ambulatory Visit: Payer: Self-pay | Admitting: *Deleted

## 2014-06-12 MED ORDER — ALPRAZOLAM 1 MG PO TABS: 1.0000 mg | ORAL_TABLET | Freq: Every day | ORAL | Status: DC

## 2014-07-14 ENCOUNTER — Ambulatory Visit (INDEPENDENT_AMBULATORY_CARE_PROVIDER_SITE_OTHER): Payer: Medicare Other | Admitting: Physician Assistant

## 2014-07-14 ENCOUNTER — Encounter: Payer: Self-pay | Admitting: Physician Assistant

## 2014-07-14 VITALS — BP 122/62 | HR 76 | Temp 98.1°F | Resp 16 | Ht 72.0 in | Wt 186.0 lb

## 2014-07-14 DIAGNOSIS — I10 Essential (primary) hypertension: Secondary | ICD-10-CM

## 2014-07-14 DIAGNOSIS — R7309 Other abnormal glucose: Secondary | ICD-10-CM | POA: Diagnosis not present

## 2014-07-14 DIAGNOSIS — Z79899 Other long term (current) drug therapy: Secondary | ICD-10-CM

## 2014-07-14 DIAGNOSIS — I2510 Atherosclerotic heart disease of native coronary artery without angina pectoris: Secondary | ICD-10-CM | POA: Diagnosis not present

## 2014-07-14 DIAGNOSIS — R7303 Prediabetes: Secondary | ICD-10-CM

## 2014-07-14 DIAGNOSIS — E559 Vitamin D deficiency, unspecified: Secondary | ICD-10-CM | POA: Diagnosis not present

## 2014-07-14 DIAGNOSIS — E782 Mixed hyperlipidemia: Secondary | ICD-10-CM | POA: Diagnosis not present

## 2014-07-14 DIAGNOSIS — E538 Deficiency of other specified B group vitamins: Secondary | ICD-10-CM

## 2014-07-14 LAB — CBC WITH DIFFERENTIAL/PLATELET
Basophils Absolute: 0 10*3/uL (ref 0.0–0.1)
Basophils Relative: 0 % (ref 0–1)
Eosinophils Absolute: 0.1 10*3/uL (ref 0.0–0.7)
Eosinophils Relative: 1 % (ref 0–5)
HEMATOCRIT: 40.1 % (ref 39.0–52.0)
HEMOGLOBIN: 13.7 g/dL (ref 13.0–17.0)
LYMPHS ABS: 1.7 10*3/uL (ref 0.7–4.0)
LYMPHS PCT: 20 % (ref 12–46)
MCH: 31.3 pg (ref 26.0–34.0)
MCHC: 34.2 g/dL (ref 30.0–36.0)
MCV: 91.6 fL (ref 78.0–100.0)
MONOS PCT: 13 % — AB (ref 3–12)
MPV: 9.5 fL (ref 9.4–12.4)
Monocytes Absolute: 1.1 10*3/uL — ABNORMAL HIGH (ref 0.1–1.0)
NEUTROS ABS: 5.7 10*3/uL (ref 1.7–7.7)
Neutrophils Relative %: 66 % (ref 43–77)
Platelets: 236 10*3/uL (ref 150–400)
RBC: 4.38 MIL/uL (ref 4.22–5.81)
RDW: 13.4 % (ref 11.5–15.5)
WBC: 8.6 10*3/uL (ref 4.0–10.5)

## 2014-07-14 LAB — MAGNESIUM: MAGNESIUM: 2.1 mg/dL (ref 1.5–2.5)

## 2014-07-14 LAB — BASIC METABOLIC PANEL WITH GFR
BUN: 15 mg/dL (ref 6–23)
CHLORIDE: 101 meq/L (ref 96–112)
CO2: 28 mEq/L (ref 19–32)
CREATININE: 1.06 mg/dL (ref 0.50–1.35)
Calcium: 9.7 mg/dL (ref 8.4–10.5)
GFR, Est African American: 71 mL/min
GFR, Est Non African American: 61 mL/min
Glucose, Bld: 85 mg/dL (ref 70–99)
Potassium: 4.5 mEq/L (ref 3.5–5.3)
Sodium: 136 mEq/L (ref 135–145)

## 2014-07-14 LAB — HEPATIC FUNCTION PANEL
ALK PHOS: 71 U/L (ref 39–117)
ALT: 9 U/L (ref 0–53)
AST: 15 U/L (ref 0–37)
Albumin: 3.8 g/dL (ref 3.5–5.2)
Bilirubin, Direct: 0.2 mg/dL (ref 0.0–0.3)
Indirect Bilirubin: 0.5 mg/dL (ref 0.2–1.2)
TOTAL PROTEIN: 6.5 g/dL (ref 6.0–8.3)
Total Bilirubin: 0.7 mg/dL (ref 0.2–1.2)

## 2014-07-14 LAB — LIPID PANEL
Cholesterol: 139 mg/dL (ref 0–200)
HDL: 56 mg/dL (ref >39)
LDL Cholesterol: 69 mg/dL (ref 0–99)
Total CHOL/HDL Ratio: 2.5 Ratio
Triglycerides: 70 mg/dL (ref <150)
VLDL: 14 mg/dL (ref 0–40)

## 2014-07-14 LAB — TSH: TSH: 1.766 u[IU]/mL (ref 0.350–4.500)

## 2014-07-14 MED ORDER — LOSARTAN POTASSIUM 100 MG PO TABS: ORAL_TABLET | ORAL | Status: DC

## 2014-07-14 MED ORDER — VERAPAMIL HCL ER 240 MG PO TBCR: EXTENDED_RELEASE_TABLET | ORAL | Status: DC

## 2014-07-14 NOTE — Progress Notes (Signed)
Assessment and Plan:  Hypertension: Continue medication but questionable too low of BP, suggest cutting losartan to 50mg , monitor blood pressure at home. Continue DASH diet.  Reminder to go to the ER if any CP, SOB, nausea, dizziness, severe HA, changes vision/speech, left arm numbness and tingling, and jaw pain. Cholesterol: Continue diet and exercise. Check cholesterol.  Hemorrhagic stroke history in april- continue bASA, Control blood pressure, cholesterol, glucose, increase exercise.  Vitamin D Def- check level and continue medications.  URI- allegra, mucinex DM, if WBC elevated will send in ABX.   Continue diet and meds as discussed. Further disposition pending results of labs.  HPI 80 y.o. male  presents for 3 month follow up with hypertension, hyperlipidemia, prediabetes and vitamin D. His blood pressure has been controlled at home, today their BP is BP: 122/62 mmHg He does workout. He denies chest pain, shortness of breath, dizziness.  He is not on cholesterol medication and denies myalgias. His cholesterol is at goal. The cholesterol last visit was:   Lab Results  Component Value Date   CHOL 151 03/24/2014   HDL 44 03/24/2014   LDLCALC 78 03/24/2014   TRIG 144 03/24/2014   CHOLHDL 3.4 03/24/2014    Last A1C in the office was:  Lab Results  Component Value Date   HGBA1C 5.3 03/24/2014   Patient is on Vitamin D supplement.   Lab Results  Component Value Date   VD25OH 66 03/24/2014     He had hemorrhagic right occipitotemporal stroke in April, son is here and states his balance is off and his short term memory is worse occuring 4 months ago, not worsening. There is no change in left eye, he is still not driving.   The patient also wishes to address some mild URI symptoms of congestion, nasal stuffiness a week ago, cough for the past few days without fever. Has not taken any OTC meds.    Current Medications:       ALPRAZolam 1 MG tablet  Commonly known as:  XANAX  Take 1  tablet (1 mg total) by mouth daily.     finasteride 5 MG tablet  Commonly known as:  PROSCAR  Take 5 mg by mouth daily. In AM     losartan 100 MG tablet  Commonly known as:  COZAAR  TAKE 1/2 TO 1 TABLET DAILY FOR BLOOD PRESSURE.     montelukast 10 MG tablet  Commonly known as:  SINGULAIR  TAKE 1 TABLET ONCE A DAY FOR ALLERGIES.     ranitidine 300 MG tablet  Commonly known as:  ZANTAC  Take 1 tablet (300 mg total) by mouth at bedtime.     verapamil 240 MG CR tablet  Commonly known as:  CALAN-SR  TAKE 1 TABLET TWICE DAILY AFTER MEALS FOR HEART RHYTHM.        Medical History:  Past Medical History  Diagnosis Date  . Hypertension   . Prediabetes   . Vitamin D deficiency   . ASHD (arteriosclerotic heart disease)    Allergies:  Allergies  Allergen Reactions  . Demerol [Meperidine]   . Penicillins      Review of Systems:  Review of Systems  Constitutional: Negative.   HENT: Positive for congestion and sore throat. Negative for ear discharge, ear pain, hearing loss, nosebleeds and tinnitus.   Eyes: Negative.   Respiratory: Positive for cough and sputum production. Negative for hemoptysis, shortness of breath, wheezing and stridor.   Cardiovascular: Negative.   Gastrointestinal: Negative.  Genitourinary: Negative.   Musculoskeletal: Negative.   Skin: Negative.   Neurological: Negative.  Negative for headaches.  Psychiatric/Behavioral: Negative.     Family history- Review and unchanged Social history- Review and unchanged Physical Exam: BP 122/62 mmHg  Pulse 76  Temp(Src) 98.1 F (36.7 C)  Resp 16  Ht 6' (1.829 m)  Wt 186 lb (84.369 kg)  BMI 25.22 kg/m2 Wt Readings from Last 3 Encounters:  07/14/14 186 lb (84.369 kg)  03/24/14 184 lb (83.462 kg)  01/20/14 181 lb (82.101 kg)   General Appearance: Well nourished, in no apparent distress. Eyes: PERRLA, EOMs, conjunctiva no swelling or erythema,  with decreased left temporal vision.  Sinuses: No  Frontal/maxillary tenderness ENT/Mouth: Ext aud canals clear, TMs without erythema, bulging. No erythema, swelling, or exudate on post pharynx.  Tonsils not swollen or erythematous. Hearing normal.  Neck: Supple, thyroid normal.  Respiratory: Respiratory effort normal, BS equal bilaterally without rales, rhonchi, wheezing or stridor.  Cardio: RRR with no MRGs. Brisk peripheral pulses without edema.  Abdomen: Soft, + BS.  Non tender, no guarding, rebound, hernias, masses. Lymphatics: Non tender without lymphadenopathy.  Musculoskeletal: Full ROM, 5/5 strength, normal gait.  Skin: Warm, dry without rashes, lesions, ecchymosis.  Neuro: Cranial nerves intact. Normal muscle tone, no cerebellar symptoms. Sensation intact.  Psych: Awake and oriented X 3, normal affect, Insight and Judgment appropriate.    Vicie Mutters, PA-C 10:40 AM [facility]

## 2014-07-14 NOTE — Patient Instructions (Signed)
Please pick one of the over the counter allergy medications below and take it once daily for allergies.  Claritin or loratadine cheapest but likely the weakest  Zyrtec or certizine at night because it can make you sleepy The strongest is allegra or fexafinadine  Cheapest at [employer], [employer], [employer]  Management of Memory Problems  There are some general things you can do to help manage your memory problems.  Your memory may not in fact recover, but by using techniques and strategies you will be able to manage your memory difficulties better.  1)  Establish a routine.  Try to establish and then stick to a regular routine.  By doing this, you will get used to what to expect and you will reduce the need to rely on your memory.  Also, try to do things at the same time of day, such as taking your medication or checking your calendar first thing in the morning.  Think about think that you can do as a part of a regular routine and make a list.  Then enter them into a daily planner to remind you.  This will help you establish a routine.  2)  Organize your environment.  Organize your environment so that it is uncluttered.  Decrease visual stimulation.  Place everyday items such as keys or cell phone in the same place every day (ie.  Basket next to front door)  Use post it notes with a brief message to yourself (ie. Turn off light, lock the door)  Use labels to indicate where things go (ie. Which cupboards are for food, dishes, etc.)  Keep a notepad and pen by the telephone to take messages  3)  Memory Aids  A diary or journal/notebook/daily planner  Making a list (shopping list, chore list, to do list that needs to be done)  Using an alarm as a reminder (kitchen timer or cell phone alarm)  Using cell phone to store information (Notes, Calendar, Reminders)  Calendar/White board placed in a prominent position  Post-it notes  In order for memory aids to be useful, you need to have good  habits.  It's no good remembering to make a note in your journal if you don't remember to look in it.  Try setting aside a certain time of day to look in journal.  4)  Improving mood and managing fatigue.  There may be other factors that contribute to memory difficulties.  Factors, such as anxiety, depression and tiredness can affect memory.  Regular gentle exercise can help improve your mood and give you more energy.  Simple relaxation techniques may help relieve symptoms of anxiety  Try to get back to completing activities or hobbies you enjoyed doing in the past.  Learn to pace yourself through activities to decrease fatigue.  Find out about some local support groups where you can share experiences with others.  Try and achieve 7-8 hours of sleep at night.

## 2014-09-17 ENCOUNTER — Encounter: Payer: Self-pay | Admitting: Internal Medicine

## 2014-10-27 ENCOUNTER — Encounter: Payer: Self-pay | Admitting: Internal Medicine

## 2014-10-27 ENCOUNTER — Ambulatory Visit (INDEPENDENT_AMBULATORY_CARE_PROVIDER_SITE_OTHER): Payer: Medicare Other | Admitting: Internal Medicine

## 2014-10-27 VITALS — BP 126/64 | HR 68 | Temp 97.5°F | Resp 16 | Ht 71.25 in | Wt 185.7 lb

## 2014-10-27 DIAGNOSIS — Z23 Encounter for immunization: Secondary | ICD-10-CM

## 2014-10-27 DIAGNOSIS — D649 Anemia, unspecified: Secondary | ICD-10-CM

## 2014-10-27 DIAGNOSIS — E559 Vitamin D deficiency, unspecified: Secondary | ICD-10-CM | POA: Diagnosis not present

## 2014-10-27 DIAGNOSIS — R7309 Other abnormal glucose: Secondary | ICD-10-CM

## 2014-10-27 DIAGNOSIS — Z1331 Encounter for screening for depression: Secondary | ICD-10-CM

## 2014-10-27 DIAGNOSIS — I10 Essential (primary) hypertension: Secondary | ICD-10-CM

## 2014-10-27 DIAGNOSIS — Z0001 Encounter for general adult medical examination with abnormal findings: Secondary | ICD-10-CM | POA: Diagnosis not present

## 2014-10-27 DIAGNOSIS — Z0000 Encounter for general adult medical examination without abnormal findings: Secondary | ICD-10-CM

## 2014-10-27 DIAGNOSIS — I6389 Other cerebral infarction: Secondary | ICD-10-CM

## 2014-10-27 DIAGNOSIS — I2510 Atherosclerotic heart disease of native coronary artery without angina pectoris: Secondary | ICD-10-CM

## 2014-10-27 DIAGNOSIS — E782 Mixed hyperlipidemia: Secondary | ICD-10-CM | POA: Diagnosis not present

## 2014-10-27 DIAGNOSIS — Z9181 History of falling: Secondary | ICD-10-CM

## 2014-10-27 DIAGNOSIS — Z111 Encounter for screening for respiratory tuberculosis: Secondary | ICD-10-CM | POA: Diagnosis not present

## 2014-10-27 DIAGNOSIS — R7303 Prediabetes: Secondary | ICD-10-CM

## 2014-10-27 DIAGNOSIS — B351 Tinea unguium: Secondary | ICD-10-CM

## 2014-10-27 DIAGNOSIS — Z1212 Encounter for screening for malignant neoplasm of rectum: Secondary | ICD-10-CM

## 2014-10-27 DIAGNOSIS — Z79899 Other long term (current) drug therapy: Secondary | ICD-10-CM | POA: Diagnosis not present

## 2014-10-27 DIAGNOSIS — R6889 Other general symptoms and signs: Secondary | ICD-10-CM | POA: Diagnosis not present

## 2014-10-27 LAB — IRON AND TIBC
%SAT: 34 % (ref 20–55)
Iron: 118 ug/dL (ref 42–165)
TIBC: 343 ug/dL (ref 215–435)
UIBC: 225 ug/dL (ref 125–400)

## 2014-10-27 LAB — CBC WITH DIFFERENTIAL/PLATELET
BASOS ABS: 0 10*3/uL (ref 0.0–0.1)
Basophils Relative: 0 % (ref 0–1)
Eosinophils Absolute: 0.1 10*3/uL (ref 0.0–0.7)
Eosinophils Relative: 2 % (ref 0–5)
HCT: 39.4 % (ref 39.0–52.0)
Hemoglobin: 13.2 g/dL (ref 13.0–17.0)
LYMPHS PCT: 28 % (ref 12–46)
Lymphs Abs: 1.5 10*3/uL (ref 0.7–4.0)
MCH: 30.8 pg (ref 26.0–34.0)
MCHC: 33.5 g/dL (ref 30.0–36.0)
MCV: 91.8 fL (ref 78.0–100.0)
MONO ABS: 0.7 10*3/uL (ref 0.1–1.0)
MONOS PCT: 13 % — AB (ref 3–12)
MPV: 9.6 fL (ref 8.6–12.4)
NEUTROS ABS: 3 10*3/uL (ref 1.7–7.7)
Neutrophils Relative %: 57 % (ref 43–77)
Platelets: 232 10*3/uL (ref 150–400)
RBC: 4.29 MIL/uL (ref 4.22–5.81)
RDW: 13.3 % (ref 11.5–15.5)
WBC: 5.2 10*3/uL (ref 4.0–10.5)

## 2014-10-27 LAB — BASIC METABOLIC PANEL WITH GFR
BUN: 19 mg/dL (ref 6–23)
CHLORIDE: 105 meq/L (ref 96–112)
CO2: 28 meq/L (ref 19–32)
CREATININE: 1.27 mg/dL (ref 0.50–1.35)
Calcium: 9.1 mg/dL (ref 8.4–10.5)
GFR, Est African American: 57 mL/min — ABNORMAL LOW
GFR, Est Non African American: 49 mL/min — ABNORMAL LOW
Glucose, Bld: 90 mg/dL (ref 70–99)
POTASSIUM: 4.6 meq/L (ref 3.5–5.3)
Sodium: 140 mEq/L (ref 135–145)

## 2014-10-27 LAB — LIPID PANEL
Cholesterol: 149 mg/dL (ref 0–200)
HDL: 51 mg/dL (ref >=40)
LDL Cholesterol: 81 mg/dL (ref 0–99)
TRIGLYCERIDES: 87 mg/dL (ref <150)
Total CHOL/HDL Ratio: 2.9 Ratio
VLDL: 17 mg/dL (ref 0–40)

## 2014-10-27 LAB — HEPATIC FUNCTION PANEL
ALK PHOS: 69 U/L (ref 39–117)
ALT: 9 U/L (ref 0–53)
AST: 16 U/L (ref 0–37)
Albumin: 3.8 g/dL (ref 3.5–5.2)
Bilirubin, Direct: 0.1 mg/dL (ref 0.0–0.3)
Indirect Bilirubin: 0.5 mg/dL (ref 0.2–1.2)
TOTAL PROTEIN: 6.3 g/dL (ref 6.0–8.3)
Total Bilirubin: 0.6 mg/dL (ref 0.2–1.2)

## 2014-10-27 LAB — TSH: TSH: 1.89 u[IU]/mL (ref 0.350–4.500)

## 2014-10-27 LAB — MAGNESIUM: Magnesium: 2.1 mg/dL (ref 1.5–2.5)

## 2014-10-27 LAB — HEMOGLOBIN A1C
Hgb A1c MFr Bld: 5.3 % (ref <5.7)
MEAN PLASMA GLUCOSE: 105 mg/dL (ref <117)

## 2014-10-27 MED ORDER — TERBINAFINE HCL 250 MG PO TABS: ORAL_TABLET | ORAL | Status: DC

## 2014-10-27 NOTE — Patient Instructions (Signed)
 Recommend the book "The END of DIETING" by Dr Joel Fuhrman   & the book "The END of DIABETES " by Dr Joel Fuhrman  At [employer] - get book & Audio CD's      Being diabetic has a  300% increased risk for heart attack, stroke, cancer, and alzheimer- type vascular dementia. It is very important that you work harder with diet by avoiding all foods that are white. Avoid white rice (brown & wild rice is OK), white potatoes (sweetpotatoes in moderation is OK), White bread or wheat bread or anything made out of white flour like bagels, donuts, rolls, buns, biscuits, cakes, pastries, cookies, pizza crust, and pasta (made from white flour & egg whites) - vegetarian pasta or spinach or wheat pasta is OK. Multigrain breads like Arnold's or [employer], or multigrain sandwich thins or flatbreads.  Diet, exercise and weight loss can reverse and cure diabetes in the early stages.  Diet, exercise and weight loss is very important in the control and prevention of complications of diabetes which affects every system in your body, ie. Brain - dementia/stroke, eyes - glaucoma/blindness, heart - heart attack/heart failure, kidneys - dialysis, stomach - gastric paralysis, intestines - malabsorption, nerves - severe painful neuritis, circulation - gangrene & loss of a leg(s), and finally cancer and Alzheimers.    I recommend avoid fried & greasy foods,  sweets/candy, white rice (brown or wild rice or Quinoa is OK), white potatoes (sweet potatoes are OK) - anything made from white flour - bagels, doughnuts, rolls, buns, biscuits,white and wheat breads, pizza crust and traditional pasta made of white flour & egg white(vegetarian pasta or spinach or wheat pasta is OK).  Multi-grain bread is OK - like multi-grain flat bread or sandwich thins. Avoid alcohol in excess. Exercise is also important.    Eat all the vegetables you want - avoid meat, especially red meat and dairy - especially cheese.  Cheese is the most  concentrated form of trans-fats which is the worst thing to clog up our arteries. Veggie cheese is OK which can be found in the fresh produce section at [employer] or [employer] or [employer]  Preventive Care for Adults A healthy lifestyle and preventive care can promote health and wellness. Preventive health guidelines for men include the following key practices:  A routine yearly physical is a good way to check with your health care provider about your health and preventative screening. It is a chance to share any concerns and updates on your health and to receive a thorough exam.  Visit your dentist for a routine exam and preventative care every 6 months. Brush your teeth twice a day and floss once a day. Good oral hygiene prevents tooth decay and gum disease.  The frequency of eye exams is based on your age, health, family medical history, use of contact lenses, and other factors. Follow your health care provider's recommendations for frequency of eye exams.  Eat a healthy diet. Foods such as vegetables, fruits, whole grains, low-fat dairy products, and lean protein foods contain the nutrients you need without too many calories. Decrease your intake of foods high in solid fats, added sugars, and salt. Eat the right amount of calories for you.Get information about a proper diet from your health care provider, if necessary.  Regular physical exercise is one of the most important things you can do for your health. Most adults should get at least 150 minutes of moderate-intensity exercise (any activity that increases your heart rate   and causes you to sweat) each week. In addition, most adults need muscle-strengthening exercises on 2 or more days a week.  Maintain a healthy weight. The body mass index (BMI) is a screening tool to identify possible weight problems. It provides an estimate of body fat based on height and weight. Your health care provider can find your BMI and can help you achieve or  maintain a healthy weight.For adults 20 years and older:  A BMI below 18.5 is considered underweight.  A BMI of 18.5 to 24.9 is normal.  A BMI of 25 to 29.9 is considered overweight.  A BMI of 30 and above is considered obese.  Maintain normal blood lipids and cholesterol levels by exercising and minimizing your intake of saturated fat. Eat a balanced diet with plenty of fruit and vegetables. Blood tests for lipids and cholesterol should begin at age 82 and be repeated every 5 years. If your lipid or cholesterol levels are high, you are over 50, or you are at high risk for heart disease, you may need your cholesterol levels checked more frequently.Ongoing high lipid and cholesterol levels should be treated with medicines if diet and exercise are not working.  If you smoke, find out from your health care provider how to quit. If you do not use tobacco, do not start.  Lung cancer screening is recommended for adults aged 56-80 years who are at high risk for developing lung cancer because of a history of smoking. A yearly low-dose CT scan of the lungs is recommended for people who have at least a 30-pack-year history of smoking and are a current smoker or have quit within the past 15 years. A pack year of smoking is smoking an average of 1 pack of cigarettes a day for 1 year (for example: 1 pack a day for 30 years or 2 packs a day for 15 years). Yearly screening should continue until the smoker has stopped smoking for at least 15 years. Yearly screening should be stopped for people who develop a health problem that would prevent them from having lung cancer treatment.  If you choose to drink alcohol, do not have more than 2 drinks per day. One drink is considered to be 12 ounces (355 mL) of beer, 5 ounces (148 mL) of wine, or 1.5 ounces (44 mL) of liquor.  Avoid use of street drugs. Do not share needles with anyone. Ask for help if you need support or instructions about stopping the use of  drugs.  High blood pressure causes heart disease and increases the risk of stroke. Your blood pressure should be checked at least every 1-2 years. Ongoing high blood pressure should be treated with medicines, if weight loss and exercise are not effective.  If you are 16-49 years old, ask your health care provider if you should take aspirin to prevent heart disease.  Diabetes screening involves taking a blood sample to check your fasting blood sugar level. Testing should be considered at a younger age or be carried out more frequently if you are overweight and have at least 1 risk factor for diabetes.  Colorectal cancer can be detected and often prevented. Most routine colorectal cancer screening begins at the age of 18 and continues through age 21. However, your health care provider may recommend screening at an earlier age if you have risk factors for colon cancer. On a yearly basis, your health care provider may provide home test kits to check for hidden blood in the stool. Use of  a small camera at the end of a tube to directly examine the colon (sigmoidoscopy or colonoscopy) can detect the earliest forms of colorectal cancer. Talk to your health care provider about this at age 50, when routine screening begins. Direct exam of the colon should be repeated every 5-10 years through age 75, unless early forms of precancerous polyps or small growths are found.  Hepatitis C blood testing is recommended for all people born from 1945 through 1965 and any individual with known risks for hepatitis C.  Screening for abdominal aortic aneurysm (AAA)  by ultrasound is recommended for people who have history of high blood pressure or who are current or former smokers.  Healthy men should  receive prostate-specific antigen (PSA) blood tests as part of routine cancer screening. Talk with your health care provider about prostate cancer screening.  Testicular cancer screening is  recommended for adult males.  Screening includes self-exam, a health care provider exam, and other screening tests. Consult with your health care provider about any symptoms you have or any concerns you have about testicular cancer.  Use sunscreen. Apply sunscreen liberally and repeatedly throughout the day. You should seek shade when your shadow is shorter than you. Protect yourself by wearing long sleeves, pants, a wide-brimmed hat, and sunglasses year round, whenever you are outdoors.  Once a month, do a whole-body skin exam, using a mirror to look at the skin on your back. Tell your health care provider about new moles, moles that have irregular borders, moles that are larger than a pencil eraser, or moles that have changed in shape or color.  Stay current with required vaccines (immunizations).  Influenza vaccine. All adults should be immunized every year.  Tetanus, diphtheria, and acellular pertussis (Td, Tdap) vaccine. An adult who has not previously received Tdap or who does not know his vaccine status should receive 1 dose of Tdap. This initial dose should be followed by tetanus and diphtheria toxoids (Td) booster doses every 10 years. Adults with an unknown or incomplete history of completing a 3-dose immunization series with Td-containing vaccines should begin or complete a primary immunization series including a Tdap dose. Adults should receive a Td booster every 10 years.  Zoster vaccine. One dose is recommended for adults aged 60 years or older unless certain conditions are present.    PREVNAR - Pneumococcal 13-valent conjugate (PCV13) vaccine. When indicated, a person who is uncertain of his immunization history and has no record of immunization should receive the PCV13 vaccine. An adult aged 19 years or older who has certain medical conditions and has not been previously immunized should receive 1 dose of PCV13 vaccine. This PCV13 should be followed with a dose of pneumococcal polysaccharide (PPSV23) vaccine. The  PPSV23 vaccine dose should be obtained at least 8 weeks after the dose of PCV13 vaccine. An adult aged 19 years or older who has certain medical conditions and previously received 1 or more doses of PPSV23 vaccine should receive 1 dose of PCV13. The PCV13 vaccine dose should be obtained 1 or more years after the last PPSV23 vaccine dose.    PNEUMOVAX - Pneumococcal polysaccharide (PPSV23) vaccine. When PCV13 is also indicated, PCV13 should be obtained first. All adults aged 65 years and older should be immunized. An adult younger than age 65 years who has certain medical conditions should be immunized. Any person who resides in a nursing home or long-term care facility should be immunized. An adult smoker should be immunized. People with an immunocompromised condition   and certain other conditions should receive both PCV13 and PPSV23 vaccines. People with human immunodeficiency virus (HIV) infection should be immunized as soon as possible after diagnosis. Immunization during chemotherapy or radiation therapy should be avoided. Routine use of PPSV23 vaccine is not recommended for American Indians, [place] Natives, or people younger than 65 years unless there are medical conditions that require PPSV23 vaccine. When indicated, people who have unknown immunization and have no record of immunization should receive PPSV23 vaccine. One-time revaccination 5 years after the first dose of PPSV23 is recommended for people aged 19-64 years who have chronic kidney failure, nephrotic syndrome, asplenia, or immunocompromised conditions. People who received 1-2 doses of PPSV23 before age 21 years should receive another dose of PPSV23 vaccine at age 48 years or later if at least 5 years have passed since the previous dose. Doses of PPSV23 are not needed for people immunized with PPSV23 at or after age 63 years.    Hepatitis A vaccine. Adults who wish to be protected from this disease, have certain high-risk conditions, work  with hepatitis A-infected animals, work in hepatitis A research labs, or travel to or work in countries with a high rate of hepatitis A should be immunized. Adults who were previously unvaccinated and who anticipate close contact with an international adoptee during the first 60 days after arrival in the [place] from a country with a high rate of hepatitis A should be immunized.    Hepatitis B vaccine. Adults should be immunized if they wish to be protected from this disease, have certain high-risk conditions, may be exposed to blood or other infectious body fluids, are household contacts or sex partners of hepatitis B positive people, are clients or workers in certain care facilities, or travel to or work in countries with a high rate of hepatitis B.   Preventive Service / Frequency   Ages 70 and over  Blood pressure check.  Lipid and cholesterol check.  Lung cancer screening. / Every year if you are aged 25-80 years and have a 30-pack-year history of smoking and currently smoke or have quit within the past 15 years. Yearly screening is stopped once you have quit smoking for at least 15 years or develop a health problem that would prevent you from having lung cancer treatment.  Fecal occult blood test (FOBT) of stool. You may not have to do this test if you get a colonoscopy every 10 years.    Hepatitis C blood test.** / For all people born from [birth year] through 1963 and any individual with known risks for hepatitis C.  Abdominal aortic aneurysm (AAA) screening./ Screening current or former smokers or have Hypertension.  Skin self-exam. / Monthly.  Influenza vaccine. / Every year.  Tetanus, diphtheria, and acellular pertussis (Tdap/Td) vaccine.** / 1 dose of Td every 10 years.   Zoster vaccine.** / 1 dose for adults aged 41 years or older.         Pneumococcal 13-valent conjugate (PCV13) vaccine.    Pneumococcal polysaccharide (PPSV23) vaccine.    Screening for abdominal  aortic aneurysm (AAA)  by ultrasound is recommended for people who have history of high blood pressure or who are current or former smokers.

## 2014-10-27 NOTE — Progress Notes (Addendum)
Patient ID: Joseph Cherry, male   DOB: [birth year], 81 y.o.   MRN: [id number]   Annual Comprehensive Examination  Assessment:   1. Essential hypertension  - Microalbumin / creatinine urine ratio - EKG 12-Lead - [place], RETROPERITNL ABD,  LTD - TSH  2. Hyperlipidemia  - Lipid panel  3. Prediabetes  - Hemoglobin A1c - Insulin, random  4. Vitamin D deficiency  - Vit D  25 hydroxy   5. ASHD w hx/o pSVT   6. Cerebral infarction, Rt Occipital Hemorrhagic   7. Depression screen   8. At low risk for fall   9. Anemia, unspecified anemia type  - Iron and TIBC  10. Screening for rectal cancer  - POC Hemoccult Bld/Stl   11. Need for prophylactic vaccination against Streptococcus pneumoniae (pneumococcus)  - Pneumococcal polysaccharide vaccine 23-valent greater than or equal to 2yo subcutaneous/IM  12. Medication management  - Urine Microscopic - CBC with Differential/Platelet - BASIC METABOLIC PANEL WITH GFR - Hepatic function panel - Magnesium  Plan:   During the course of the visit the patient was educated and counseled about appropriate screening and preventive services including:    Pneumococcal vaccine   Influenza vaccine  Td vaccine  Screening electrocardiogram  Bone densitometry screening  Colorectal cancer screening  Diabetes screening  Glaucoma screening  Nutrition counseling   Advanced directives: requested  Screening recommendations, referrals: Vaccinations: Immunization History  Administered Date(s) Administered  . Influenza Split 06/03/2013  . Influenza, High Dose Seasonal PF 05/20/2014  . Pneumococcal Polysaccharide-23 10/27/2014  . Td 08/09/2003  . Zoster 08/25/2010  Prevnar vaccine ordered Hep B vaccine not indicated  Nutrition assessed and recommended  Colonoscopy 1992 preceding total colectomy for severe diverticular dz Recommended yearly ophthalmology/optometry visit for glaucoma screening and checkup Recommended yearly  dental visit for hygiene and checkup Advanced directives - yes  Conditions/risks identified: BMI: Discussed weight loss, diet, and increase physical activity.  Increase physical activity: AHA recommends 150 minutes of physical activity a week.  Medications reviewed PREDiabetes is at goal, ACE/ARB therapy: Yes. Urinary Incontinence is not an issue: discussed non pharmacology and pharmacology options.  Fall risk: low- discussed PT, home fall assessment, medications.   Subjective:    Joseph Cherry presents for complete physical.  This very nice 81 y.o. [place] presents for 3 month follow up with Hypertension, ASHD, hx/o pSVT, Occipital Hemorrhagic CVA (2014), Hyperlipidemia, Pre-Diabetes and Vitamin D Deficiency.    Patient is treated for HTN & BP has been controlled at home. Patient has hx/o pSVT which has been controlled with Verapamil and today's BP: 126/64 mmHg. Patient has had no complaints of any cardiac type chest pain, palpitations, dyspnea/orthopnea/PND, dizziness, claudication, or dependent edema. In Apr 2015 patient had a hemorrhagic Rt Occipital temporal CVA and has persistent Lt Hemianopsia and thus has not been driving and thus is distressful to him being dependent on others for transportation.   Hyperlipidemia is controlled with diet & meds. Patient denies myalgias or other med SE's. Last Lipids were 07/14/2014: Cholesterol, Total 139; HDL-C 56; LDL (calc) 69; Triglycerides 70 in   Also, the patient has history of  PreDiabetes and has had no symptoms of reactive hypoglycemia, diabetic polys, paresthesias or visual blurring.  Last A1c was  5.3% on  03/24/2014.   Further, the patient also has history of Vitamin D Deficiency and supplements vitamin D without any suspected side-effects. Last vitamin D was  66 on  03/24/2014.  Names of Other Physician/Practitioners you currently use: 1. [facility] here for primary care 2. Dr Herbert Deaner, eye doctor, last  visit - Aug 2015 3. Dr Arita Miss, DDS, dentist, last visit due Apr 2016  Patient Care Team: Unk Pinto, MD as PCP - General (Internal Medicine) Carolan Clines, MD as Consulting Physician (Urology)  Medication Review: Medication Sig  . ALPRAZolam (XANAX) 1 MG tablet Take 1 tablet (1 mg total) by mouth daily.  . finasteride (PROSCAR) 5 MG tablet Take 5 mg by mouth daily. In AM  . losartan (COZAAR) 100 MG tablet TAKE 1/2 TO 1 TABLET DAILY FOR BLOOD PRESSURE.  . montelukast (SINGULAIR) 10 MG tablet TAKE 1 TABLET ONCE A DAY FOR ALLERGIES.  Marland Kitchen ranitidine (ZANTAC) 300 MG tablet Take 1 tablet (300 mg total) by mouth at bedtime.  . verapamil (CALAN-SR) 240 MG  TAKE 1 TABLET TWICE DAILY AFTER MEALS FOR HEART RHYTHM.   Current Problems (verified) Patient Active Problem List   Diagnosis Date Noted  . CVA, Rt Temporal/Occipital Hemorrhagic (11/2013)  12/02/2013  . Vitamin D deficiency 09/12/2013  . Hyperlipidemia 09/12/2013  . Medication management 09/12/2013  . Hypertension   . Prediabetes   . ASHD (arteriosclerotic heart disease)    Screening Tests Health Maintenance  Topic Date Due  . COLONOSCOPY  03/15/1974  . PNA vac Low Risk Adult (1 of 2 - PCV13) 03/15/1989  . TETANUS/TDAP  08/08/2013  . INFLUENZA VACCINE  03/09/2015  . ZOSTAVAX  Completed   Immunization History  Administered Date(s) Administered  . Influenza Split 06/03/2013  . Influenza, High Dose Seasonal PF 05/20/2014  . Pneumococcal Polysaccharide-23 10/27/2014  . Td 08/09/2003 - declined booster today  . Zoster 08/25/2010   Preventative care: Last colonoscopy: 1992  Preceding total colectomy  History reviewed: allergies, current medications, past family history, past medical history, past social history, past surgical history and problem list  Risk Factors: Tobacco History  Substance Use Topics  . Smoking status: Current Some Day Smoker    Types: Cigars  . Smokeless tobacco: Not on file  . Alcohol  Use: 4.2 oz/week    7 Standard drinks or equivalent per week     Comment: occ   He does smoke occas cigar. Are there smokers in your home (other than you)?  No  Alcohol Current alcohol use: 2 liquor drinks per day(s)  Caffeine Current caffeine use: coffee 2 cups /day  Exercise Current exercise: walking  Nutrition/Diet Current diet: in general, a "healthy" diet    Cardiac risk factors: advanced age (older than 38 for men, 61 for women), dyslipidemia, hypertension, male gender, sedentary lifestyle and smoking/ tobacco exposure.  Depression Screen (Note: if answer to either of the following is "Yes", a more complete depression screening is indicated)   Q1: Over the past two weeks, have you felt down, depressed or hopeless? No  Q2: Over the past two weeks, have you felt little interest or pleasure in doing things? No  Have you lost interest or pleasure in daily life? No  Do you often feel hopeless? No  Do you cry easily over simple problems? No  Activities of Daily Living In your present state of health, do you have any difficulty performing the following activities?:  Driving? Not driving since CVA in Apr 2015. Managing money?  No (son monitors his bank accts on line) Feeding yourself? No Getting from bed to chair? No Climbing a flight of stairs? No Preparing food and eating?: No Bathing or showering? No Getting dressed: No Getting to the toilet?  No Using the toilet:No Moving around from place to place: No In the past year have you fallen or had a near fall?:No   Are you sexually active?  No  Do you have more than one partner?  No  Vision Difficulties: No  Hearing Difficulties: No Do you often ask people to speak up or repeat themselves? No Do you experience ringing or noises in your ears? No Do you have difficulty understanding soft or whispered voices? No  Cognition  Do you feel that you have a problem with memory?occasionally forgetful  Do you often misplace  items? Sometimes  Do you feel safe at home?  Yes  Advanced directives Does patient have a [place]? Yes Does patient have a Living Will? Yes  Past Medical History  Diagnosis Date  . Hypertension   . Prediabetes   . Vitamin D deficiency   . ASHD (arteriosclerotic heart disease)     Past Surgical History  Procedure Laterality Date  . Colectomy  1992  . Cataract extraction w/ intraocular lens implant Right 1993  . Cholecystectomy  2001  . Cataract extraction w/ intraocular lens implant Left 2001    ROS: Constitutional: Denies fever, chills, weight loss/gain, headaches, insomnia, fatigue, night sweats or change in appetite. Eyes: Denies redness, blurred vision, diplopia, discharge, itchy or watery eyes.  ENT: Denies discharge, congestion, post nasal drip, epistaxis, sore throat, earache, hearing loss, dental pain, Tinnitus, Vertigo, Sinus pain or snoring.  Cardio: Denies chest pain, palpitations, irregular heartbeat, syncope, dyspnea, diaphoresis, orthopnea, PND, claudication or edema Respiratory: denies cough, dyspnea, DOE, pleurisy, hoarseness, laryngitis or wheezing.  Gastrointestinal: Denies dysphagia, heartburn, reflux, water brash, pain, cramps, nausea, vomiting, bloating, diarrhea, constipation, hematemesis, melena, hematochezia, jaundice or hemorrhoids Genitourinary: Denies dysuria, frequency, urgency, nocturia, hesitancy, discharge, hematuria or flank pain Musculoskeletal: Denies arthralgia, myalgia, stiffness, Jt. Swelling, pain, limp or strain/sprain. Denies Falls. Skin: Denies puritis, rash, hives, warts, acne, eczema or change in skin lesion Neuro: No weakness, tremor, incoordination, spasms, paresthesia or pain Psychiatric: Denies confusion, memory loss or sensory loss. Denies Depression. Endocrine: Denies change in weight, skin, hair change, nocturia, and paresthesia, diabetic polys, visual blurring or hyper / hypo glycemic episodes.  Heme/Lymph: No  excessive bleeding, bruising or enlarged lymph nodes.  Objective:     BP 126/64   Pulse 68  Temp 97.5 F   Resp 16  Ht 5' 11.25"   Wt 185 lb 11.2 oz     BMI 25.71  General Appearance:  Alert  WD/WN, male , in no apparent distress. Eyes: PERRLA, EOMs, conjunctiva no swelling or erythema, normal fundi and vessels. Sinuses: No frontal/maxillary tenderness ENT/Mouth: EACs patent / TMs  nl. Nares clear without erythema, swelling, mucoid exudates. Oral hygiene is good. No erythema, swelling, or exudate. Tongue normal, non-obstructing. Tonsils not swollen or erythematous. Hearing normal.  Neck: Supple, thyroid normal. No bruits, nodes or JVD. Respiratory: Respiratory effort normal.  BS equal and clear bilateral without rales, rhonci, wheezing or stridor. Cardio: Heart sounds are normal with regular rate and rhythm and no murmurs, rubs or gallops. Peripheral pulses are normal and equal bilaterally without edema. No aortic or femoral bruits. Chest: symmetric with normal excursions and percussion.  Abdomen: Flat, soft, with nl bowel sounds. Nontender, no guarding, rebound, hernias, masses, or organomegaly.  Lymphatics: Non tender without lymphadenopathy.  Musculoskeletal: Full ROM all peripheral extremities, joint stability, 5/5 strength, and normal gait. Skin: Warm and dry without rashes, lesions, cyanosis, clubbing or  ecchymosis.  Neuro:  Cranial nerves intact, reflexes equal bilaterally. Normal muscle tone, no cerebellar symptoms. Sensation intact.  Pysch: Awake and oriented X 3 with normal affect, insight and judgment appropriate.   Cognitive Testing  Alert? Yes  Normal Appearance? Yes  Oriented to person? Yes  Place? Yes   Time? Yes  Recall of three objects?  Yes  Can perform simple calculations? Yes  Displays appropriate judgment? Yes  Can read the correct time from a watch/clock? Yes  Medicare Attestation I have personally reviewed: The patient's medical and social history Their  use of alcohol, tobacco or illicit drugs Their current medications and supplements The patient's functional ability including ADLs,fall risks, home safety risks, cognitive, and hearing and visual impairment Diet and physical activities Evidence for depression or mood disorders  The patient's weight, height, BMI, and visual acuity have been recorded in the chart.  I have made referrals, counseling, and provided education to the patient based on review of the above and I have provided the patient with a written personalized care plan for preventive services.    Gelisa Tieken DAVID, MD   10/27/2014

## 2014-10-28 LAB — URINALYSIS, MICROSCOPIC ONLY
BACTERIA UA: NONE SEEN
CASTS: NONE SEEN
Crystals: NONE SEEN
Squamous Epithelial / LPF: NONE SEEN

## 2014-10-28 LAB — VITAMIN D 25 HYDROXY (VIT D DEFICIENCY, FRACTURES): Vit D, 25-Hydroxy: 57 ng/mL (ref 30–100)

## 2014-10-28 LAB — MICROALBUMIN / CREATININE URINE RATIO
CREATININE, URINE: 415.3 mg/dL
MICROALB/CREAT RATIO: 2.9 mg/g (ref 0.0–30.0)
Microalb, Ur: 1.2 mg/dL (ref <2.0)

## 2014-10-28 LAB — INSULIN, RANDOM: INSULIN: 6.9 u[IU]/mL (ref 2.0–19.6)

## 2014-11-17 NOTE — Progress Notes (Addendum)
Patient ID: Joseph Cherry, male   DOB: [birth year], 81 y.o.   MRN: [id number]

## 2014-11-17 NOTE — Addendum Note (Signed)
Addended by: Unk Pinto on: 11/17/2014 02:38 PM   Modules accepted: Level of Service

## 2014-11-26 ENCOUNTER — Other Ambulatory Visit: Payer: Self-pay | Admitting: Dermatology

## 2014-11-26 DIAGNOSIS — D1800 Hemangioma unspecified site: Secondary | ICD-10-CM | POA: Diagnosis not present

## 2014-11-26 DIAGNOSIS — Z85828 Personal history of other malignant neoplasm of skin: Secondary | ICD-10-CM | POA: Diagnosis not present

## 2014-11-26 DIAGNOSIS — L309 Dermatitis, unspecified: Secondary | ICD-10-CM | POA: Diagnosis not present

## 2014-11-26 DIAGNOSIS — L570 Actinic keratosis: Secondary | ICD-10-CM | POA: Diagnosis not present

## 2014-11-26 DIAGNOSIS — D1801 Hemangioma of skin and subcutaneous tissue: Secondary | ICD-10-CM | POA: Diagnosis not present

## 2014-11-26 DIAGNOSIS — D485 Neoplasm of uncertain behavior of skin: Secondary | ICD-10-CM | POA: Diagnosis not present

## 2014-11-26 DIAGNOSIS — L821 Other seborrheic keratosis: Secondary | ICD-10-CM | POA: Diagnosis not present

## 2014-12-08 ENCOUNTER — Other Ambulatory Visit: Payer: Self-pay

## 2014-12-15 ENCOUNTER — Other Ambulatory Visit: Payer: Self-pay | Admitting: Internal Medicine

## 2014-12-22 ENCOUNTER — Encounter: Payer: Self-pay | Admitting: Internal Medicine

## 2014-12-22 ENCOUNTER — Ambulatory Visit (INDEPENDENT_AMBULATORY_CARE_PROVIDER_SITE_OTHER): Payer: Medicare Other | Admitting: Internal Medicine

## 2014-12-22 VITALS — BP 128/62 | HR 116 | Temp 98.0°F | Resp 16 | Ht 71.25 in | Wt 186.0 lb

## 2014-12-22 DIAGNOSIS — I2510 Atherosclerotic heart disease of native coronary artery without angina pectoris: Secondary | ICD-10-CM | POA: Diagnosis not present

## 2014-12-22 DIAGNOSIS — R000 Tachycardia, unspecified: Secondary | ICD-10-CM

## 2014-12-22 DIAGNOSIS — I471 Supraventricular tachycardia: Secondary | ICD-10-CM

## 2014-12-22 MED ORDER — BISOPROLOL FUMARATE 5 MG PO TABS: 5.0000 mg | ORAL_TABLET | Freq: Every day | ORAL | Status: DC

## 2014-12-22 NOTE — Patient Instructions (Signed)
Nonspecific Tachycardia  Tachycardia is a faster than normal heartbeat (more than 100 beats per minute). In adults, the heart normally beats between 60 and 100 times a minute. A fast heartbeat may be a normal response to exercise or stress. It does not necessarily mean that something is wrong. However, sometimes when your heart beats too fast it may not be able to pump enough blood to the rest of your body. This can result in chest pain, shortness of breath, dizziness, and even fainting. Nonspecific tachycardia means that the specific cause or pattern of your tachycardia is unknown.  CAUSES   Tachycardia may be harmless or it may be due to a more serious underlying cause. Possible causes of tachycardia include:  · Exercise or exertion.  · Fever.  · Pain or injury.  · Infection.  · Loss of body fluids (dehydration).  · Overactive thyroid.  · Lack of red blood cells (anemia).  · Anxiety and stress.  · Alcohol.  · Caffeine.  · Tobacco products.  · Diet pills.  · Illegal drugs.  · Heart disease.  SYMPTOMS  · Rapid or irregular heartbeat (palpitations).  · Suddenly feeling your heart beating (cardiac awareness).  · Dizziness.  · Tiredness (fatigue).  · Shortness of breath.  · Chest pain.  · Nausea.  · Fainting.  DIAGNOSIS   Your caregiver will perform a physical exam and take your medical history. In some cases, a heart specialist (cardiologist) may be consulted. Your caregiver may also order:  · Blood tests.  · Electrocardiography. This test records the electrical activity of your heart.  · A heart monitoring test.  TREATMENT   Treatment will depend on the likely cause of your tachycardia. The goal is to treat the underlying cause of your tachycardia. Treatment methods may include:  · Replacement of fluids or blood through an intravenous (IV) tube for moderate to severe dehydration or anemia.  · New medicines or changes in your current medicines.  · Diet and lifestyle changes.  · Treatment for certain  infections.  · Stress relief or relaxation methods.  HOME CARE INSTRUCTIONS   · Rest.  · Drink enough fluids to keep your urine clear or pale yellow.  · Do not smoke.  · Avoid:  ¨ Caffeine.  ¨ Tobacco.  ¨ Alcohol.  ¨ Chocolate.  ¨ Stimulants such as over-the-counter diet pills or pills that help you stay awake.  ¨ Situations that cause anxiety or stress.  ¨ Illegal drugs such as marijuana, phencyclidine (PCP), and cocaine.  · Only take medicine as directed by your caregiver.  · Keep all follow-up appointments as directed by your caregiver.  SEEK IMMEDIATE MEDICAL CARE IF:   · You have pain in your chest, upper arms, jaw, or neck.  · You become weak, dizzy, or feel faint.  · You have palpitations that will not go away.  · You vomit, have diarrhea, or pass blood in your stool.  · Your skin is cool, pale, and wet.  · You have a fever that will not go away with rest, fluids, and medicine.  MAKE SURE YOU:   · Understand these instructions.  · Will watch your condition.  · Will get help right away if you are not doing well or get worse.  Document Released: 09/01/2004 Document Revised: 10/17/2011 Document Reviewed: 07/05/2011  ExitCare® Patient Information ©2015 [employer]. This information is not intended to replace advice given to you by your health care provider. Make sure you discuss any questions   you have with your health care provider.

## 2014-12-22 NOTE — Progress Notes (Signed)
   Subjective:    Patient ID: Joseph Cherry, male    DOB: 1922/03/09, 81 y.o.   MRN: [id number]  HPI  Patient presents with his son to the office for a fast heart rate which has been going on for the past 3-5 days.  His son reports that he has had a pulse that has been going from 110 to 115.  His blood pressure has been good at home.  He used to be on linoxen per the patients son on an as needed bases.  He reports that the racing pulse is consistent.  He has not had this in the past.  No chest pain, no shortness of breath no changes in activity.  He has had no leg swelling.  He reports that he has not orthopnea or PND.  No changes other than cutting verapamil in 1/2 3 months ago.    Review of Systems  Constitutional: Negative for fever, chills and fatigue.  Respiratory: Negative for cough, chest tightness and shortness of breath.   Cardiovascular: Positive for palpitations. Negative for chest pain and leg swelling.  Gastrointestinal: Negative for nausea and vomiting.  Neurological: Negative for dizziness, numbness and headaches.       Objective:   Physical Exam  Constitutional: He is oriented to person, place, and time. He appears well-developed and well-nourished. No distress.  HENT:  Head: Normocephalic.  Mouth/Throat: Oropharynx is clear and moist. No oropharyngeal exudate.  Eyes: Conjunctivae are normal. No scleral icterus.  Neck: Normal range of motion. Neck supple. No JVD present. No thyromegaly present.  Cardiovascular: Regular rhythm, normal heart sounds and intact distal pulses.  Tachycardia present.  Exam reveals no gallop and no friction rub.   No murmur heard. Pulses:      Dorsalis pedis pulses are 2+ on the right side, and 2+ on the left side.       Posterior tibial pulses are 2+ on the right side, and 2+ on the left side.  Pulmonary/Chest: Effort normal and breath sounds normal. No respiratory distress. He has no wheezes. He has no rales. He exhibits no tenderness.   Abdominal: Soft. Bowel sounds are normal. He exhibits no distension and no mass. There is no tenderness. There is no rebound and no guarding.  Musculoskeletal: Normal range of motion.  Lymphadenopathy:    He has no cervical adenopathy.  Neurological: He is alert and oriented to person, place, and time.  Skin: Skin is warm and dry. He is not diaphoretic.  Psychiatric: He has a normal mood and affect. His behavior is normal. Judgment and thought content normal.  Nursing note and vitals reviewed.   Filed Vitals:   12/22/14 0946  BP: 128/62  Pulse: 116  Temp: 98 F (36.7 C)  Resp: 16         Assessment & Plan:    1. Sinus tachycardia -EKG shows sinus tachycardia.  BP appropriate.  No cardiac symptoms currently.  No afib on EKG.  Try on low dose beta blocker.  If BP dips below 110/60 patients family to cut losartan in half.  Recheck in 1 week.  Plan discussed with Dr. Melford Aase who agrees with the above plan.  - bisoprolol (ZEBETA) 5 MG tablet; Take 1 tablet (5 mg total) by mouth daily.  Dispense: 30 tablet; Refill: 0

## 2014-12-26 NOTE — Addendum Note (Signed)
Addended by: Avaleen Brownley A on: 12/26/2014 02:12 PM   Modules accepted: Orders

## 2014-12-31 ENCOUNTER — Ambulatory Visit (INDEPENDENT_AMBULATORY_CARE_PROVIDER_SITE_OTHER): Payer: Medicare Other | Admitting: Internal Medicine

## 2014-12-31 ENCOUNTER — Encounter: Payer: Self-pay | Admitting: Internal Medicine

## 2014-12-31 VITALS — BP 140/72 | HR 64 | Temp 98.2°F | Resp 16 | Ht 71.25 in | Wt 186.0 lb

## 2014-12-31 DIAGNOSIS — R002 Palpitations: Secondary | ICD-10-CM

## 2014-12-31 DIAGNOSIS — I2510 Atherosclerotic heart disease of native coronary artery without angina pectoris: Secondary | ICD-10-CM | POA: Diagnosis not present

## 2014-12-31 NOTE — Patient Instructions (Signed)
Bisoprolol tablets What is this medicine? BISOPROLOL (bis [place] proe lol) is a beta-blocker. Beta-blockers reduce the workload on the heart and help it to beat more regularly. This medicine is used to treat high blood pressure. This medicine may be used for other purposes; ask your health care provider or pharmacist if you have questions. COMMON BRAND NAME(S): Zebeta What should I tell my health care provider before I take this medicine? They need to know if you have any of these conditions: -chest pain (angina) -diabetes -heart or vessel disease like slow heart rate, worsening heart failure, heart block, sick sinus syndrome or Raynaud's disease -kidney disease -liver disease -lung or breathing disease, like asthma or emphysema -pheochromocytoma -thyroid disease -an unusual or allergic reaction to bisoprolol, other beta-blockers, medicines, foods, dyes, or preservatives -pregnant or trying to get pregnant -breast-feeding How should I use this medicine? Take this medicine by mouth with a glass of water. Follow the directions on the prescription label. You can take this medicine with or without food. Take your doses at regular intervals. Do not take your medicine more often than directed. Do not stop taking this medicine suddenly. This could lead to serious heart-related effects. Talk to your pediatrician regarding the use of this medicine in children. Special care may be needed. Overdosage: If you think you have taken too much of this medicine contact a poison control center or emergency room at once. NOTE: This medicine is only for you. Do not share this medicine with others. What if I miss a dose? If you miss a dose, take it as soon as you can. If it is almost time for your next dose, take only that dose. Do not take double or extra doses. What may interact with this medicine? This medicine may interact with the following medications: -certain medicines for blood pressure, heart disease,  irregular heart beat -NSAIDs, medicines for pain and inflammation, like ibuprofen or naproxen -rifampin This list may not describe all possible interactions. Give your health care provider a list of all the medicines, herbs, non-prescription drugs, or dietary supplements you use. Also tell them if you smoke, drink alcohol, or use illegal drugs. Some items may interact with your medicine. What should I watch for while using this medicine? Visit your doctor or health care professional for regular checks on your progress. Check your heart rate and blood pressure regularly while you are taking this medicine. Ask your doctor or health care professional what your heart rate and blood pressure should be, and when you should contact him or her. You may get drowsy or dizzy. Do not drive, use machinery, or do anything that needs mental alertness until you know how this drug affects you. Do not stand or sit up quickly, especially if you are an older patient. This reduces the risk of dizzy or fainting spells. Alcohol can make you more drowsy and dizzy. Avoid alcoholic drinks. This medicine can affect blood sugar levels. If you have diabetes, check with your doctor or health care professional before you change your diet or the dose of your diabetic medicine. Do not treat yourself for coughs, colds, or pain while you are taking this medicine without asking your doctor or health care professional for advice. Some ingredients may increase your blood pressure. What side effects may I notice from receiving this medicine? Side effects that you should report to your doctor or health care professional as soon as possible: -allergic reactions like skin rash, itching or hives, swelling of the face, lips,  or tongue -breathing problems -chest pain -cold, tingling, or numb hands or feet -confusion -irregular, slow heartbeat -muscle aches and pains -sweating -swollen legs or ankles -tremors -vomiting Side effects that  usually do not require medical attention (report to your doctor or health care professional if they continue or are bothersome): -anxiety -change in sex drive or performance -depression -diarrhea -dry or burning eyes -headache -nausea This list may not describe all possible side effects. Call your doctor for medical advice about side effects. You may report side effects to FDA at 1-800-FDA-1088. Where should I keep my medicine? Keep out of the reach of children. Store at room temperature between 20 and 25 degrees C (68 and 77 degrees F). Protect from moisture. Throw away any unused medicine after the expiration date. NOTE: This sheet is a summary. It may not cover all possible information. If you have questions about this medicine, talk to your doctor, pharmacist, or health care provider.  2015, Elsevier/Gold Standard. (2013-03-29 14:30:04)

## 2014-12-31 NOTE — Progress Notes (Signed)
   Subjective:    Patient ID: Joseph Cherry, male    DOB: [birth year], 81 y.o.   MRN: [id number]  HPI  Patient returns to the office for reevaluation of palpitations.  He was started 10 days ago on bisoprolol 5 mg and he has been doing a lot better on this medication.  He does not have any more palpitations.  He does report that his blood pressure has been doing great.  His daughter reports that he has been a little down lately.  He feels that this is due to the fact that he cannot drive anymore due to his stroke and his age and he feels that he is a burden to others.   Review of Systems  Constitutional: Negative for fever, chills and fatigue.  Respiratory: Negative for cough, chest tightness and shortness of breath.   Cardiovascular: Negative for chest pain, palpitations and leg swelling.  Psychiatric/Behavioral: Negative for confusion, sleep disturbance, self-injury, decreased concentration and agitation. The patient is not nervous/anxious.        Objective:   Physical Exam  Constitutional: He is oriented to person, place, and time. He appears well-developed and well-nourished. No distress.  HENT:  Head: Normocephalic and atraumatic.  Mouth/Throat: Oropharynx is clear and moist. No oropharyngeal exudate.  Eyes: Conjunctivae are normal. No scleral icterus.  Neck: Normal range of motion. Neck supple. No JVD present. No thyromegaly present.  Cardiovascular: Normal rate, regular rhythm and intact distal pulses.  Exam reveals no gallop and no friction rub.   No murmur heard. Pulmonary/Chest: Effort normal and breath sounds normal. No respiratory distress. He has no wheezes. He has no rales. He exhibits no tenderness.  Abdominal: Soft. Bowel sounds are normal. He exhibits no distension and no mass. There is no tenderness. There is no rebound and no guarding.  Musculoskeletal: Normal range of motion.  Lymphadenopathy:    He has no cervical adenopathy.  Neurological: He is alert and oriented  to person, place, and time.  Skin: Skin is warm and dry. He is not diaphoretic.  Psychiatric: He has a normal mood and affect. His behavior is normal. Judgment and thought content normal.  Nursing note and vitals reviewed.   Filed Vitals:   12/31/14 1522  BP: 140/72  Pulse: 64  Temp: 98.2 F (36.8 C)  Resp: 16         Assessment & Plan:    1. Palpitations -resolved on bisoprolol 5 mg -daughter feels that the medication is causing depression -can try 2.5 mg bisoprolol and if increased palpitations then increase to 5 mg  -recheck next month

## 2015-01-30 ENCOUNTER — Ambulatory Visit: Payer: Self-pay | Admitting: Internal Medicine

## 2015-02-10 ENCOUNTER — Other Ambulatory Visit: Payer: Self-pay | Admitting: Physician Assistant

## 2015-02-10 ENCOUNTER — Other Ambulatory Visit: Payer: Self-pay | Admitting: Internal Medicine

## 2015-02-10 MED ORDER — ALPRAZOLAM 1 MG PO TABS: 1.0000 mg | ORAL_TABLET | Freq: Every day | ORAL | Status: DC

## 2015-04-06 ENCOUNTER — Other Ambulatory Visit: Payer: Self-pay | Admitting: Internal Medicine

## 2015-05-05 ENCOUNTER — Ambulatory Visit (INDEPENDENT_AMBULATORY_CARE_PROVIDER_SITE_OTHER): Payer: Medicare Other | Admitting: Internal Medicine

## 2015-05-05 ENCOUNTER — Encounter: Payer: Self-pay | Admitting: Internal Medicine

## 2015-05-05 VITALS — BP 136/60 | HR 64 | Temp 97.5°F | Resp 16 | Ht 71.5 in | Wt 193.8 lb

## 2015-05-05 DIAGNOSIS — E559 Vitamin D deficiency, unspecified: Secondary | ICD-10-CM | POA: Diagnosis not present

## 2015-05-05 DIAGNOSIS — Z6824 Body mass index (BMI) 24.0-24.9, adult: Secondary | ICD-10-CM

## 2015-05-05 DIAGNOSIS — Z23 Encounter for immunization: Secondary | ICD-10-CM

## 2015-05-05 DIAGNOSIS — Z79899 Other long term (current) drug therapy: Secondary | ICD-10-CM | POA: Diagnosis not present

## 2015-05-05 DIAGNOSIS — I10 Essential (primary) hypertension: Secondary | ICD-10-CM

## 2015-05-05 DIAGNOSIS — Z6826 Body mass index (BMI) 26.0-26.9, adult: Secondary | ICD-10-CM | POA: Insufficient documentation

## 2015-05-05 DIAGNOSIS — R7303 Prediabetes: Secondary | ICD-10-CM

## 2015-05-05 DIAGNOSIS — I2510 Atherosclerotic heart disease of native coronary artery without angina pectoris: Secondary | ICD-10-CM | POA: Diagnosis not present

## 2015-05-05 DIAGNOSIS — R7309 Other abnormal glucose: Secondary | ICD-10-CM

## 2015-05-05 DIAGNOSIS — E782 Mixed hyperlipidemia: Secondary | ICD-10-CM

## 2015-05-05 DIAGNOSIS — K219 Gastro-esophageal reflux disease without esophagitis: Secondary | ICD-10-CM | POA: Diagnosis not present

## 2015-05-05 LAB — BASIC METABOLIC PANEL WITH GFR
BUN: 16 mg/dL (ref 7–25)
CO2: 29 mmol/L (ref 20–31)
CREATININE: 0.95 mg/dL (ref 0.70–1.11)
Calcium: 9.6 mg/dL (ref 8.6–10.3)
Chloride: 104 mmol/L (ref 98–110)
GFR, Est African American: 81 mL/min (ref >=60)
GFR, Est Non African American: 70 mL/min (ref >=60)
Glucose, Bld: 94 mg/dL (ref 65–99)
Potassium: 4.7 mmol/L (ref 3.5–5.3)
Sodium: 138 mmol/L (ref 135–146)

## 2015-05-05 LAB — HEPATIC FUNCTION PANEL
ALT: 9 U/L (ref 9–46)
AST: 14 U/L (ref 10–35)
Albumin: 3.8 g/dL (ref 3.6–5.1)
Alkaline Phosphatase: 73 U/L (ref 40–115)
BILIRUBIN TOTAL: 0.7 mg/dL (ref 0.2–1.2)
Bilirubin, Direct: 0.1 mg/dL (ref <=0.2)
Indirect Bilirubin: 0.6 mg/dL (ref 0.2–1.2)
Total Protein: 6.5 g/dL (ref 6.1–8.1)

## 2015-05-05 LAB — LIPID PANEL
CHOL/HDL RATIO: 3.2 ratio (ref <=5.0)
Cholesterol: 152 mg/dL (ref 125–200)
HDL: 48 mg/dL (ref >=40)
LDL Cholesterol: 89 mg/dL (ref <130)
Triglycerides: 73 mg/dL (ref <150)
VLDL: 15 mg/dL (ref <30)

## 2015-05-05 LAB — CBC WITH DIFFERENTIAL/PLATELET
Basophils Absolute: 0.1 10*3/uL (ref 0.0–0.1)
Basophils Relative: 1 % (ref 0–1)
EOS ABS: 0.2 10*3/uL (ref 0.0–0.7)
EOS PCT: 3 % (ref 0–5)
HEMATOCRIT: 37.7 % — AB (ref 39.0–52.0)
Hemoglobin: 12.8 g/dL — ABNORMAL LOW (ref 13.0–17.0)
LYMPHS ABS: 1.5 10*3/uL (ref 0.7–4.0)
Lymphocytes Relative: 25 % (ref 12–46)
MCH: 31.3 pg (ref 26.0–34.0)
MCHC: 34 g/dL (ref 30.0–36.0)
MCV: 92.2 fL (ref 78.0–100.0)
MPV: 9.3 fL (ref 8.6–12.4)
Monocytes Absolute: 0.8 10*3/uL (ref 0.1–1.0)
Monocytes Relative: 13 % — ABNORMAL HIGH (ref 3–12)
Neutro Abs: 3.5 10*3/uL (ref 1.7–7.7)
Neutrophils Relative %: 58 % (ref 43–77)
PLATELETS: 209 10*3/uL (ref 150–400)
RBC: 4.09 MIL/uL — ABNORMAL LOW (ref 4.22–5.81)
RDW: 13.4 % (ref 11.5–15.5)
WBC: 6 10*3/uL (ref 4.0–10.5)

## 2015-05-05 LAB — MAGNESIUM: MAGNESIUM: 2.1 mg/dL (ref 1.5–2.5)

## 2015-05-05 NOTE — Patient Instructions (Signed)

## 2015-05-05 NOTE — Progress Notes (Signed)
Patient ID: Joseph Cherry, male   DOB: 1921-10-11, 81 y.o.   MRN: [id number]   This very nice 81 y.o. WWM presents for  follow up with Hypertension, Hyperlipidemia, Pre-Diabetes and Vitamin D Deficiency.    Patient is treated for HTN since 1988 & BP has been controlled at home. Today's BP: 136/60 mmHg. Patient has hx/o pSVT and this has been controlled with Verapamil. Patient has had no complaints of any cardiac type chest pain, palpitations, dyspnea/orthopnea/PND, dizziness, claudication, or dependent edema. Patient had a hemorrhagic  Rt occipital CVA in Apr 2015 and has persistent Left temporal field cut in the left eye and is due back at [facility] for f/u VF testing. He has not driven since that time.    Hyperlipidemia is controlled with diet & meds. Patient denies myalgias or other med SE's. Last Lipids were at goal -  Cholesterol 149; HDL 51; LDL 81; Triglycerides 87 on 10/27/2014.   Also, the patient has history of PreDiabetes since 2012 with A1c 5.6% and elevated insulin 32 and has had no symptoms of reactive hypoglycemia, diabetic polys, paresthesias or visual blurring.  Last A1c was 5.3% on 10/27/2014.   Further, the patient also has history of Vitamin D Deficiency of 30 in 2008 and supplements vitamin D without any suspected side-effects. Last vitamin D was 57 on 10/27/2014.  Medication Sig  . ALPRAZolam (XANAX) 1 MG tablet TAKE 1 TABLET THREE TIMES DAILY AS NEEDED FOR ANXIETY.  . bisoprolol (ZEBETA) 5 MG tablet TAKE 1 TABLET ONCE DAILY.  . finasteride (PROSCAR) 5 MG tablet Take 5 mg by mouth daily. In AM  . losartan (COZAAR) 100 MG tablet TAKE 1/2 TO 1 TABLET DAILY FOR BLOOD PRESSURE.  . montelukast (SINGULAIR) 10 MG tablet TAKE 1 TABLET ONCE A DAY FOR ALLERGIES.  Marland Kitchen ranitidine (ZANTAC) 300 MG tablet TAKE ONE TABLET AT BEDTIME.  . verapamil (CALAN-SR) 240 MG CR tablet TAKE 1 TABLET TWICE DAILY AFTER MEALS FOR HEART RHYTHM.   Allergies  Allergen Reactions  . Demerol [Meperidine]   .  Penicillins    PMHx:   Past Medical History  Diagnosis Date  . Hypertension   . Prediabetes   . Vitamin D deficiency   . ASHD (arteriosclerotic heart disease)    Immunization History  Administered Date(s) Administered  . Influenza Split 06/03/2013  . Influenza, High Dose Seasonal PF 05/20/2014, 05/05/2015  . Pneumococcal Polysaccharide-23 10/27/2014  . Td 08/09/2003  . Zoster 08/25/2010   Past Surgical History  Procedure Laterality Date  . Colectomy  1992  . Cataract extraction w/ intraocular lens implant Right 1993  . Cholecystectomy  2001  . Cataract extraction w/ intraocular lens implant Left 2001   FHx:    Reviewed / unchanged  SHx:    Reviewed / unchanged  Systems Review:  Constitutional: Denies fever, chills, wt changes, headaches, insomnia, fatigue, night sweats, change in appetite. Eyes: Denies redness, blurred vision, diplopia, discharge, itchy, watery eyes.  ENT: Denies discharge, congestion, post nasal drip, epistaxis, sore throat, earache, hearing loss, dental pain, tinnitus, vertigo, sinus pain, snoring.  CV: Denies chest pain, palpitations, irregular heartbeat, syncope, dyspnea, diaphoresis, orthopnea, PND, claudication or edema. Respiratory: denies cough, dyspnea, DOE, pleurisy, hoarseness, laryngitis, wheezing.  Gastrointestinal: Denies dysphagia, odynophagia, heartburn, reflux, water brash, abdominal pain or cramps, nausea, vomiting, bloating, diarrhea, constipation, hematemesis, melena, hematochezia  or hemorrhoids. Genitourinary: Denies dysuria, frequency, urgency, nocturia, hesitancy, discharge, hematuria or flank pain. Musculoskeletal: Denies arthralgias, myalgias, stiffness, jt. swelling, pain, limping or strain/sprain.  Skin: Denies pruritus, rash, hives, warts, acne, eczema or change in skin lesion(s). Neuro: No weakness, tremor, incoordination, spasms, paresthesia or pain. Psychiatric: Denies confusion, memory loss or sensory loss. Endo: Denies change  in weight, skin or hair change.  Heme/Lymph: No excessive bleeding, bruising or enlarged lymph nodes.  Physical Exam  BP 136/60 mmHg  Pulse 64  Temp(Src) 97.5 F (36.4 C)  Resp 16  Ht 5' 11.5" (1.816 m)  Wt 193 lb 12.8 oz (87.907 kg)  BMI 26.66 kg/m2  Appears well nourished and in no distress. Eyes: PERRLA, EOMs, conjunctiva no swelling or erythema. Sinuses: No frontal/maxillary tenderness ENT/Mouth: EAC's clear, TM's nl w/o erythema, bulging. Nares clear w/o erythema, swelling, exudates. Oropharynx clear without erythema or exudates. Oral hygiene is good. Tongue normal, non obstructing. Hearing intact.  Neck: Supple. Thyroid nl. Car 2+/2+ without bruits, nodes or JVD. Chest: Respirations nl with BS clear & equal w/o rales, rhonchi, wheezing or stridor.  Cor: Heart sounds normal w/ regular rate and rhythm without sig. murmurs, gallops, clicks, or rubs. Peripheral pulses normal and equal  without edema.  Abdomen: Soft & bowel sounds normal. Non-tender w/o guarding, rebound, hernias, masses, or organomegaly.  Lymphatics: Unremarkable.  Musculoskeletal: Full ROM all peripheral extremities, joint stability, 5/5 strength, and normal gait.  Skin: Warm, dry without exposed rashes, lesions or ecchymosis apparent.  Neuro: Cranial nerves intact, reflexes equal bilaterally. Sensory-motor testing grossly intact. Tendon reflexes grossly intact.  Pysch: Alert & oriented x 3.  Insight and judgement nl & appropriate. No ideations.  Assessment and Plan:  1. Essential hypertension  - TSH  2. Hyperlipidemia  - Lipid panel  3. Prediabetes  - Hemoglobin A1c - Insulin, random  4. Vitamin D deficiency  - Vit D  25 hydroxy  5. ASHD   6. Gastroesophageal reflux disease, esophagitis presence not specified   7. Need for prophylactic vaccination and inoculation against influenza  - Flu vaccine HIGH DOSE PF (Fluzone High dose)  8. BMI 26.0-26.9,adult   9. Medication management  - CBC  with Differential/Platelet - BASIC METABOLIC PANEL WITH GFR - Hepatic function panel - Magnesium   Recommended regular exercise, BP monitoring, weight control, and discussed med and SE's. Recommended labs to assess and monitor clinical status. Further disposition pending results of labs. Over 30 minutes of exam, counseling, chart review was performed

## 2015-05-06 LAB — VITAMIN D 25 HYDROXY (VIT D DEFICIENCY, FRACTURES): VIT D 25 HYDROXY: 66 ng/mL (ref 30–100)

## 2015-05-06 LAB — HEMOGLOBIN A1C
Hgb A1c MFr Bld: 5.3 % (ref <5.7)
Mean Plasma Glucose: 105 mg/dL (ref <117)

## 2015-05-06 LAB — INSULIN, RANDOM: Insulin: 4.8 u[IU]/mL (ref 2.0–19.6)

## 2015-05-06 LAB — TSH: TSH: 2.16 u[IU]/mL (ref 0.350–4.500)

## 2015-05-25 DIAGNOSIS — R3913 Splitting of urinary stream: Secondary | ICD-10-CM | POA: Diagnosis not present

## 2015-05-25 DIAGNOSIS — C61 Malignant neoplasm of prostate: Secondary | ICD-10-CM | POA: Diagnosis not present

## 2015-05-27 DIAGNOSIS — H353111 Nonexudative age-related macular degeneration, right eye, early dry stage: Secondary | ICD-10-CM | POA: Diagnosis not present

## 2015-05-27 DIAGNOSIS — H5201 Hypermetropia, right eye: Secondary | ICD-10-CM | POA: Diagnosis not present

## 2015-05-27 DIAGNOSIS — H5212 Myopia, left eye: Secondary | ICD-10-CM | POA: Diagnosis not present

## 2015-05-27 DIAGNOSIS — H353121 Nonexudative age-related macular degeneration, left eye, early dry stage: Secondary | ICD-10-CM | POA: Diagnosis not present

## 2015-05-27 DIAGNOSIS — H52223 Regular astigmatism, bilateral: Secondary | ICD-10-CM | POA: Diagnosis not present

## 2015-06-11 DIAGNOSIS — D3617 Benign neoplasm of peripheral nerves and autonomic nervous system of trunk, unspecified: Secondary | ICD-10-CM | POA: Diagnosis not present

## 2015-06-11 DIAGNOSIS — D485 Neoplasm of uncertain behavior of skin: Secondary | ICD-10-CM | POA: Diagnosis not present

## 2015-06-11 DIAGNOSIS — Z85828 Personal history of other malignant neoplasm of skin: Secondary | ICD-10-CM | POA: Diagnosis not present

## 2015-06-11 DIAGNOSIS — C44219 Basal cell carcinoma of skin of left ear and external auricular canal: Secondary | ICD-10-CM | POA: Diagnosis not present

## 2015-06-11 DIAGNOSIS — L570 Actinic keratosis: Secondary | ICD-10-CM | POA: Diagnosis not present

## 2015-06-11 DIAGNOSIS — D1801 Hemangioma of skin and subcutaneous tissue: Secondary | ICD-10-CM | POA: Diagnosis not present

## 2015-06-11 DIAGNOSIS — L821 Other seborrheic keratosis: Secondary | ICD-10-CM | POA: Diagnosis not present

## 2015-06-11 DIAGNOSIS — Z85820 Personal history of malignant melanoma of skin: Secondary | ICD-10-CM | POA: Diagnosis not present

## 2015-08-04 ENCOUNTER — Other Ambulatory Visit: Payer: Self-pay | Admitting: Physician Assistant

## 2015-08-04 DIAGNOSIS — F411 Generalized anxiety disorder: Secondary | ICD-10-CM

## 2015-08-11 ENCOUNTER — Ambulatory Visit (INDEPENDENT_AMBULATORY_CARE_PROVIDER_SITE_OTHER): Payer: Medicare Other | Admitting: Internal Medicine

## 2015-08-11 VITALS — BP 136/60 | HR 70 | Temp 97.8°F | Resp 16 | Ht 71.25 in | Wt 190.0 lb

## 2015-08-11 DIAGNOSIS — Z79899 Other long term (current) drug therapy: Secondary | ICD-10-CM | POA: Diagnosis not present

## 2015-08-11 DIAGNOSIS — E782 Mixed hyperlipidemia: Secondary | ICD-10-CM | POA: Diagnosis not present

## 2015-08-11 DIAGNOSIS — I10 Essential (primary) hypertension: Secondary | ICD-10-CM

## 2015-08-11 DIAGNOSIS — R7303 Prediabetes: Secondary | ICD-10-CM | POA: Diagnosis not present

## 2015-08-11 DIAGNOSIS — E559 Vitamin D deficiency, unspecified: Secondary | ICD-10-CM | POA: Diagnosis not present

## 2015-08-11 NOTE — Progress Notes (Signed)
Patient ID: Joseph Cherry, male   DOB: Jul 09, 1922, 82 y.o.   MRN: [id number]  Assessment and Plan:  Hypertension:  -Continue medication,  -monitor blood pressure at home.  -Continue DASH diet.   -Reminder to go to the ER if any CP, SOB, nausea, dizziness, severe HA, changes vision/speech, left arm numbness and tingling, and jaw pain.  Cholesterol: -Continue diet and exercise.     Pre-diabetes: -Continue diet and exercise.    Vitamin D Def: -continue medications.   Memory Loss -likely dementia -discussed blood work and also possible medications like aricept -patient and son both declined.    Continue diet and meds as discussed. Further disposition pending results of labs.  HPI 82 y.o. male  presents for 3 month follow up with hypertension, hyperlipidemia, prediabetes and vitamin D.   His blood pressure has been controlled at home, today their BP is BP: 136/60 mmHg.   He does workout. He denies chest pain, shortness of breath, dizziness.   He is on cholesterol medication and denies myalgias. His cholesterol is at goal. The cholesterol last visit was:   Lab Results  Component Value Date   CHOL 152 05/05/2015   HDL 48 05/05/2015   LDLCALC 89 05/05/2015   TRIG 73 05/05/2015   CHOLHDL 3.2 05/05/2015     He has been working on diet and exercise for prediabetes, and denies foot ulcerations, hyperglycemia, hypoglycemia , increased appetite, nausea, paresthesia of the feet, polydipsia, polyuria, visual disturbances, vomiting and weight loss. Last A1C in the office was:  Lab Results  Component Value Date   HGBA1C 5.3 05/05/2015    Patient is on Vitamin D supplement.  Lab Results  Component Value Date   VD25OH 48 05/05/2015     He and his son are worried about his memory.  They report that he has issues with short term memory. They also report that he seems to have a hard time engaging in activities with others.   Current Medications:  Current Outpatient Prescriptions on File  Prior to Visit  Medication Sig Dispense Refill  . bisoprolol (ZEBETA) 5 MG tablet TAKE 1 TABLET ONCE DAILY. 30 tablet 3  . finasteride (PROSCAR) 5 MG tablet Take 5 mg by mouth daily. In AM    . losartan (COZAAR) 100 MG tablet TAKE 1/2 TO 1 TABLET DAILY FOR BLOOD PRESSURE. 90 tablet 3  . montelukast (SINGULAIR) 10 MG tablet TAKE 1 TABLET ONCE A DAY FOR ALLERGIES. 30 tablet PRN  . ranitidine (ZANTAC) 300 MG tablet TAKE ONE TABLET AT BEDTIME. 90 tablet 3  . verapamil (CALAN-SR) 240 MG CR tablet TAKE 1 TABLET TWICE DAILY AFTER MEALS FOR HEART RHYTHM. 180 tablet 3   No current facility-administered medications on file prior to visit.    Medical History:  Past Medical History  Diagnosis Date  . Hypertension   . Prediabetes   . Vitamin D deficiency   . ASHD (arteriosclerotic heart disease)     Allergies:  Allergies  Allergen Reactions  . Demerol [Meperidine]   . Penicillins      Review of Systems:  Review of Systems  Constitutional: Negative for fever, chills and malaise/fatigue.  HENT: Negative for congestion, ear pain and sore throat.   Eyes: Negative.   Respiratory: Negative for cough, shortness of breath and wheezing.   Cardiovascular: Negative for chest pain, palpitations and leg swelling.  Gastrointestinal: Negative for diarrhea, constipation, blood in stool and melena.  Genitourinary: Negative.   Skin: Negative.   Neurological: Negative for  dizziness, sensory change, loss of consciousness and headaches.  Psychiatric/Behavioral: Negative.  Negative for depression. The patient is not nervous/anxious and does not have insomnia.     Family history- Review and unchanged  Social history- Review and unchanged  Physical Exam: BP 136/60 mmHg  Pulse 70  Temp(Src) 97.8 F (36.6 C) (Temporal)  Resp 16  Ht 5' 11.25" (1.81 m)  Wt 190 lb (86.183 kg)  BMI 26.31 kg/m2 Wt Readings from Last 3 Encounters:  08/11/15 190 lb (86.183 kg)  05/05/15 193 lb 12.8 oz (87.907 kg)   12/31/14 186 lb (84.369 kg)    General Appearance: Well nourished well developed, in no apparent distress. Eyes: PERRLA, EOMs, conjunctiva no swelling or erythema ENT/Mouth: Ear canals normal without obstruction, swelling, erythma, discharge.  TMs normal bilaterally.  Oropharynx moist, clear, without exudate, or postoropharyngeal swelling. Neck: Supple, thyroid normal,no cervical adenopathy  Respiratory: Respiratory effort normal, Breath sounds clear A&P without rhonchi, wheeze, or rale.  No retractions, no accessory usage. Cardio: RRR with no MRGs. Brisk peripheral pulses without edema.  Abdomen: Soft, + BS,  Non tender, no guarding, rebound, hernias, masses. Musculoskeletal: Full ROM, 5/5 strength, Normal gait Skin: Warm, dry without rashes, lesions, ecchymosis.  Neuro: Awake and oriented X 3, Cranial nerves intact. Normal muscle tone, no cerebellar symptoms. Psych: Normal affect, some repetition of stories, poor short term medication. Insight and Judgment appropriate.    Starlyn Skeans, PA-C 10:27 AM [facility]

## 2015-08-31 ENCOUNTER — Other Ambulatory Visit: Payer: Self-pay | Admitting: Physician Assistant

## 2015-09-01 ENCOUNTER — Encounter: Payer: Self-pay | Admitting: Physician Assistant

## 2015-09-01 ENCOUNTER — Ambulatory Visit (INDEPENDENT_AMBULATORY_CARE_PROVIDER_SITE_OTHER): Payer: Medicare Other | Admitting: Physician Assistant

## 2015-09-01 VITALS — BP 140/80 | HR 110 | Temp 98.2°F | Resp 16 | Ht 71.25 in | Wt 191.0 lb

## 2015-09-01 DIAGNOSIS — J0100 Acute maxillary sinusitis, unspecified: Secondary | ICD-10-CM | POA: Diagnosis not present

## 2015-09-01 MED ORDER — AZITHROMYCIN 250 MG PO TABS: ORAL_TABLET | ORAL | Status: AC

## 2015-09-01 MED ORDER — PREDNISONE 20 MG PO TABS: ORAL_TABLET | ORAL | Status: DC

## 2015-09-01 NOTE — Patient Instructions (Signed)
Sinusitis can be uncomfortable. People with sinusitis have congestion with yellow/green/gray discharge, sinus pain/pressure, pain around the eyes. Sinus infections almost ALWAYS stem from a viral infection and antibiotics don't work against a virus. Even when bacteria is responsible, the infections usually clear up on their own in a week or so.   PLEASE TRY TO DO OVER THE COUNTER TREATMENT AND PREDNISONE FOR 5-7 DAYS AND IF YOU ARE NOT GETTING BETTER OR GETTING WORSE THEN YOU CAN START ON AN ANTIBIOTIC GIVEN.  Can take the prednisone AT NIGHT WITH DINNER, it take 8-12 hours to start working so it will NOT affect your sleeping if you take it at night with your food!! Take two pills the first night and 1 or two pill the second night and then 1 pill the other nights.   Risk of antibiotic use: About 1 in 4 people who take antibiotics have side effects including stomach problems, dizziness, or rashes. Those problems clear up soon after stopping the drugs, but in rare cases antibiotics can cause severe allergic reaction. Over use of antibiotics also encourages the growth of bacteria that can't be controlled easily with drugs. That makes you more vunerable to antibiotic-resistant infections and undermines the benefits of antibiotics for others.   Waste of Money: Antibiotics often aren't very expensive, but any money spent on unnecessary drugs is money down the drain.   When are antibiotics needed? Only when symptoms last longer than a week.  Start to improve but then worsen again  -It can take up to 2 weeks to feel better.   -If you do not get better in 7-10 days (Have fever, facial pain, dental pain and swelling), then please call the office and it is now appropriate to start an antibiotic.   -Please take Tylenol or Ibuprofen for pain. -Acetaminiphen 325mg orally every 4-6 hours for pain.  Max: 10 per day -Ibuprofen 200mg orally every 6-8 hours for pain.  Take with food to avoid ulcers.   Max 10 per  day  Please pick one of the over the counter allergy medications below and take it once daily for allergies.  Claritin or loratadine cheapest but likely the weakest  Zyrtec or certizine at night because it can make you sleepy The strongest is allegra or fexafinadine  Cheapest at [employer], [employer], [employer]  -While drinking fluids, pinch and hold nose close and swallow.  This will help open up your eustachian tubes to drain the fluid behind your ear drums. -Try steam showers to open your nasal passages.   Drink lots of water to stay hydrated and to thin mucous.  Flonase/Nasonex is to help the inflammation.  Take 2 sprays in each nostril at bedtime.  Make sure you spray towards the outside of each nostril towards the outer corner of your eye, hold nose close and tilt head back.  This will help the medication get into your sinuses.  If you do not like this medication, then use saline nasal sprays same directions as above for Flonase. Stop the medication right away if you get blurring of your vision or nose bleeds.  Sinusitis Sinusitis is redness, soreness, and inflammation of the paranasal sinuses. Paranasal sinuses are air pockets within the bones of your face (beneath the eyes, the middle of the forehead, or above the eyes). In healthy paranasal sinuses, mucus is able to drain out, and air is able to circulate through them by way of your nose. However, when your paranasal sinuses are inflamed, mucus and air can   become trapped. This can allow bacteria and other germs to grow and cause infection. Sinusitis can develop quickly and last only a short time (acute) or continue over a long period (chronic). Sinusitis that lasts for more than 12 weeks is considered chronic.  CAUSES  Causes of sinusitis include: Allergies. Structural abnormalities, such as displacement of the cartilage that separates your nostrils (deviated septum), which can decrease the air flow through your nose and sinuses and affect sinus  drainage. Functional abnormalities, such as when the small hairs (cilia) that line your sinuses and help remove mucus do not work properly or are not present. SIGNS AND SYMPTOMS  Symptoms of acute and chronic sinusitis are the same. The primary symptoms are pain and pressure around the affected sinuses. Other symptoms include: Upper toothache. Earache. Headache. Bad breath. Decreased sense of smell and taste. A cough, which worsens when you are lying flat. Fatigue. Fever. Thick drainage from your nose, which often is green and may contain pus (purulent). Swelling and warmth over the affected sinuses. DIAGNOSIS  Your health care provider will perform a physical exam. During the exam, your health care provider may: Look in your nose for signs of abnormal growths in your nostrils (nasal polyps).  Tap over the affected sinus to check for signs of infection. View the inside of your sinuses (endoscopy) using an imaging device that has a light attached (endoscope). If your health care provider suspects that you have chronic sinusitis, one or more of the following tests may be recommended: Allergy tests. Nasal culture. A sample of mucus is taken from your nose, sent to a lab, and screened for bacteria. Nasal cytology. A sample of mucus is taken from your nose and examined by your health care provider to determine if your sinusitis is related to an allergy. TREATMENT  Most cases of acute sinusitis are related to a viral infection and will resolve on their own within 10 days. Sometimes medicines are prescribed to help relieve symptoms (pain medicine, decongestants, nasal steroid sprays, or saline sprays).  However, for sinusitis related to a bacterial infection, your health care provider will prescribe antibiotic medicines. These are medicines that will help kill the bacteria causing the infection.  Rarely, sinusitis is caused by a fungal infection. In theses cases, your health care provider will  prescribe antifungal medicine. For some cases of chronic sinusitis, surgery is needed. Generally, these are cases in which sinusitis recurs more than 3 times per year, despite other treatments. HOME CARE INSTRUCTIONS  Drink plenty of water. Water helps thin the mucus so your sinuses can drain more easily. Use a humidifier. Inhale steam 3 to 4 times a day (for example, sit in the bathroom with the shower running). Apply a warm, moist washcloth to your face 3 to 4 times a day, or as directed by your health care provider. Use saline nasal sprays to help moisten and clean your sinuses. Take medicines only as directed by your health care provider. If you were prescribed either an antibiotic or antifungal medicine, finish it all even if you start to feel better. SEEK IMMEDIATE MEDICAL CARE IF: You have increasing pain or severe headaches. You have nausea, vomiting, or drowsiness. You have swelling around your face. You have vision problems. You have a stiff neck. You have difficulty breathing. MAKE SURE YOU:  Understand these instructions. Will watch your condition. Will get help right away if you are not doing well or get worse. Document Released: 07/25/2005 Document Revised: 12/09/2013 Document Reviewed: 08/09/2011 [employer]   Patient Information 2015 [employer]. This information is not intended to replace advice given to you by your health care provider. Make sure you discuss any questions you have with your health care provider.   

## 2015-09-01 NOTE — Progress Notes (Signed)
   Subjective:    Patient ID: Joseph Cherry, male    DOB: Feb 13, 1922, 82 y.o.   MRN: [id number]  HPI 82 y.o. WM presents with left ear pain, daughter is here and states it is the lymp hnodes around his ear.  States he has pain in front on his ears. He has had some sinus congestion, but daughter denies cough/runny nose.  Denies fever, chills.   Blood pressure 140/80, pulse 110, temperature 98.2 F (36.8 C), temperature source Temporal, resp. rate 16, height 5' 11.25" (1.81 m), weight 191 lb (86.637 kg), SpO2 96 %.  Past Medical History  Diagnosis Date  . Hypertension   . Prediabetes   . Vitamin D deficiency   . ASHD (arteriosclerotic heart disease)    Current Outpatient Prescriptions on File Prior to Visit  Medication Sig Dispense Refill  . ALPRAZolam (XANAX) 1 MG tablet Take 1 mg by mouth at bedtime as needed for sleep.    . bisoprolol (ZEBETA) 5 MG tablet TAKE 1 TABLET ONCE DAILY. 30 tablet 3  . finasteride (PROSCAR) 5 MG tablet Take 5 mg by mouth daily. In AM    . losartan (COZAAR) 100 MG tablet TAKE 1/2 TO 1 TABLET DAILY FOR BLOOD PRESSURE. 90 tablet 3  . montelukast (SINGULAIR) 10 MG tablet TAKE 1 TABLET ONCE A DAY FOR ALLERGIES. 30 tablet PRN  . ranitidine (ZANTAC) 300 MG tablet TAKE ONE TABLET AT BEDTIME. 90 tablet 3  . verapamil (CALAN-SR) 240 MG CR tablet TAKE 1 TABLET TWICE DAILY AFTER MEALS FOR HEART RHYTHM. 180 tablet 1   No current facility-administered medications on file prior to visit.    Review of Systems  Constitutional: Negative for fever, chills and diaphoresis.  HENT: Positive for congestion, ear pain, postnasal drip and sinus pressure. Negative for hearing loss, sneezing, sore throat, trouble swallowing and voice change.   Eyes: Negative.   Respiratory: Negative for cough, chest tightness, shortness of breath and wheezing.   Cardiovascular: Negative.   Gastrointestinal: Negative.   Genitourinary: Negative.   Musculoskeletal: Negative for neck pain.   Neurological: Negative.  Negative for headaches.       Objective:   Physical Exam  Constitutional: He appears well-developed and well-nourished. No distress.  HENT:  Head: Normocephalic and atraumatic.  Right Ear: External ear normal.  Left Ear: External ear normal.  Nose: Right sinus exhibits no maxillary sinus tenderness and no frontal sinus tenderness. Left sinus exhibits maxillary sinus tenderness.  Mouth/Throat: Oropharynx is clear and moist.  Eyes: Conjunctivae are normal. Pupils are equal, round, and reactive to light.  Neck: Normal range of motion. Neck supple.  Cardiovascular: Normal rate, regular rhythm and normal heart sounds.   Pulmonary/Chest: Effort normal and breath sounds normal. He has no wheezes.  Abdominal: Soft. Bowel sounds are normal.  Lymphadenopathy:       Head (left side): Preauricular and posterior auricular adenopathy present. No submental, no submandibular, no tonsillar and no occipital adenopathy present.    He has no cervical adenopathy.  Skin: Skin is warm and dry.       Assessment & Plan:  Sinusitis-  Treat zpak/prednisone, if not better will get [place] left neck Follow up dentist

## 2015-10-05 ENCOUNTER — Other Ambulatory Visit: Payer: Self-pay | Admitting: Internal Medicine

## 2015-11-11 ENCOUNTER — Encounter: Payer: Self-pay | Admitting: Internal Medicine

## 2015-12-09 ENCOUNTER — Other Ambulatory Visit: Payer: Self-pay | Admitting: Internal Medicine

## 2015-12-14 ENCOUNTER — Other Ambulatory Visit: Payer: Self-pay | Admitting: Internal Medicine

## 2015-12-22 ENCOUNTER — Encounter: Payer: Self-pay | Admitting: Internal Medicine

## 2015-12-28 ENCOUNTER — Other Ambulatory Visit: Payer: Self-pay | Admitting: Physician Assistant

## 2016-02-22 DIAGNOSIS — L570 Actinic keratosis: Secondary | ICD-10-CM | POA: Diagnosis not present

## 2016-02-22 DIAGNOSIS — D1801 Hemangioma of skin and subcutaneous tissue: Secondary | ICD-10-CM | POA: Diagnosis not present

## 2016-02-22 DIAGNOSIS — L821 Other seborrheic keratosis: Secondary | ICD-10-CM | POA: Diagnosis not present

## 2016-02-22 DIAGNOSIS — Z85828 Personal history of other malignant neoplasm of skin: Secondary | ICD-10-CM | POA: Diagnosis not present

## 2016-02-22 DIAGNOSIS — Z85820 Personal history of malignant melanoma of skin: Secondary | ICD-10-CM | POA: Diagnosis not present

## 2016-02-23 ENCOUNTER — Other Ambulatory Visit: Payer: Self-pay | Admitting: *Deleted

## 2016-02-23 MED ORDER — ALPRAZOLAM 1 MG PO TABS: 1.0000 mg | ORAL_TABLET | Freq: Three times a day (TID) | ORAL | Status: DC | PRN

## 2016-03-02 ENCOUNTER — Ambulatory Visit (INDEPENDENT_AMBULATORY_CARE_PROVIDER_SITE_OTHER): Payer: Medicare Other | Admitting: Internal Medicine

## 2016-03-02 ENCOUNTER — Encounter: Payer: Self-pay | Admitting: Internal Medicine

## 2016-03-02 VITALS — BP 136/76 | HR 72 | Temp 97.4°F | Resp 16 | Ht 71.25 in | Wt 191.8 lb

## 2016-03-02 DIAGNOSIS — Z125 Encounter for screening for malignant neoplasm of prostate: Secondary | ICD-10-CM | POA: Diagnosis not present

## 2016-03-02 DIAGNOSIS — I10 Essential (primary) hypertension: Secondary | ICD-10-CM | POA: Diagnosis not present

## 2016-03-02 DIAGNOSIS — I2510 Atherosclerotic heart disease of native coronary artery without angina pectoris: Secondary | ICD-10-CM | POA: Diagnosis not present

## 2016-03-02 DIAGNOSIS — Z79899 Other long term (current) drug therapy: Secondary | ICD-10-CM

## 2016-03-02 DIAGNOSIS — Z1212 Encounter for screening for malignant neoplasm of rectum: Secondary | ICD-10-CM

## 2016-03-02 DIAGNOSIS — E559 Vitamin D deficiency, unspecified: Secondary | ICD-10-CM

## 2016-03-02 DIAGNOSIS — I6359 Cerebral infarction due to unspecified occlusion or stenosis of other cerebral artery: Secondary | ICD-10-CM

## 2016-03-02 DIAGNOSIS — K219 Gastro-esophageal reflux disease without esophagitis: Secondary | ICD-10-CM

## 2016-03-02 DIAGNOSIS — E782 Mixed hyperlipidemia: Secondary | ICD-10-CM

## 2016-03-02 DIAGNOSIS — N320 Bladder-neck obstruction: Secondary | ICD-10-CM | POA: Diagnosis not present

## 2016-03-02 DIAGNOSIS — Z23 Encounter for immunization: Secondary | ICD-10-CM | POA: Diagnosis not present

## 2016-03-02 DIAGNOSIS — Z136 Encounter for screening for cardiovascular disorders: Secondary | ICD-10-CM | POA: Diagnosis not present

## 2016-03-02 DIAGNOSIS — R7303 Prediabetes: Secondary | ICD-10-CM | POA: Diagnosis not present

## 2016-03-02 LAB — HEPATIC FUNCTION PANEL
ALK PHOS: 61 U/L (ref 40–115)
ALT: 8 U/L — ABNORMAL LOW (ref 9–46)
AST: 15 U/L (ref 10–35)
Albumin: 3.6 g/dL (ref 3.6–5.1)
BILIRUBIN DIRECT: 0.1 mg/dL (ref <=0.2)
BILIRUBIN INDIRECT: 0.5 mg/dL (ref 0.2–1.2)
BILIRUBIN TOTAL: 0.6 mg/dL (ref 0.2–1.2)
Total Protein: 6.2 g/dL (ref 6.1–8.1)

## 2016-03-02 LAB — CBC WITH DIFFERENTIAL/PLATELET
BASOS PCT: 0 %
Basophils Absolute: 0 cells/uL (ref 0–200)
EOS PCT: 3 %
Eosinophils Absolute: 132 cells/uL (ref 15–500)
HCT: 38.7 % (ref 38.5–50.0)
Hemoglobin: 12.8 g/dL — ABNORMAL LOW (ref 13.2–17.1)
LYMPHS ABS: 1232 {cells}/uL (ref 850–3900)
Lymphocytes Relative: 28 %
MCH: 30.8 pg (ref 27.0–33.0)
MCHC: 33.1 g/dL (ref 32.0–36.0)
MCV: 93 fL (ref 80.0–100.0)
MONOS PCT: 11 %
MPV: 9.8 fL (ref 7.5–12.5)
Monocytes Absolute: 484 cells/uL (ref 200–950)
NEUTROS ABS: 2552 {cells}/uL (ref 1500–7800)
Neutrophils Relative %: 58 %
PLATELETS: 204 10*3/uL (ref 140–400)
RBC: 4.16 MIL/uL — AB (ref 4.20–5.80)
RDW: 14.1 % (ref 11.0–15.0)
WBC: 4.4 10*3/uL (ref 3.8–10.8)

## 2016-03-02 LAB — BASIC METABOLIC PANEL WITH GFR
BUN: 15 mg/dL (ref 7–25)
CHLORIDE: 106 mmol/L (ref 98–110)
CO2: 28 mmol/L (ref 20–31)
CREATININE: 1.21 mg/dL — AB (ref 0.70–1.11)
Calcium: 9.1 mg/dL (ref 8.6–10.3)
GFR, EST NON AFRICAN AMERICAN: 52 mL/min — AB (ref >=60)
GFR, Est African American: 60 mL/min (ref >=60)
Glucose, Bld: 76 mg/dL (ref 65–99)
POTASSIUM: 4.4 mmol/L (ref 3.5–5.3)
SODIUM: 141 mmol/L (ref 135–146)

## 2016-03-02 LAB — HEMOGLOBIN A1C
Hgb A1c MFr Bld: 5.1 % (ref <5.7)
Mean Plasma Glucose: 100 mg/dL

## 2016-03-02 LAB — LIPID PANEL
CHOL/HDL RATIO: 2.8 ratio (ref <=5.0)
Cholesterol: 143 mg/dL (ref 125–200)
HDL: 52 mg/dL (ref >=40)
LDL Cholesterol: 71 mg/dL (ref <130)
Triglycerides: 100 mg/dL (ref <150)
VLDL: 20 mg/dL (ref <30)

## 2016-03-02 LAB — MAGNESIUM: MAGNESIUM: 2 mg/dL (ref 1.5–2.5)

## 2016-03-02 LAB — TSH: TSH: 2.04 m[IU]/L (ref 0.40–4.50)

## 2016-03-02 NOTE — Progress Notes (Signed)
[facility]   Unk Pinto, M.D.    Uvaldo Bristle. Silverio Lay, P.A.-C      Starlyn Skeans, P.A.-C   [facility]                [address] [place] [id number] Telephone [phone] Telefax [phone]  Comprehensive Evaluation & Examination     This very nice 82 y.o. WWM presents for a  comprehensive evaluation and management of multiple medical co-morbidities.  Patient has been followed for HTN, ASHD, Prediabetes, Hyperlipidemia and Vitamin D Deficiency.     HTN predates circa 1988. Patient's BP has been controlled at home.Today's BP: 136/76. Patient has hx/o ASHD and pSVT and had been treated for years with Digoxin and in 2004 had a negative Cardiolite. Patient has been off of Digoxin for a couple of years now & has had no c/o of  any cardiac symptoms as chest pain, palpitations, shortness of breath, dizziness or ankle swelling.     Patient's hyperlipidemia is controlled with diet. Last lipids were at goal: Lab Results  Component Value Date   CHOL 152 05/05/2015   HDL 48 05/05/2015   LDLCALC 89 05/05/2015   TRIG 73 05/05/2015   CHOLHDL 3.2 05/05/2015      Patient has prediabetes with A1c 5.7% since 2011 and patient denies reactive hypoglycemic symptoms, visual blurring, diabetic polys or paresthesias. Last A1c was back in the normal range.  Lab Results  Component Value Date   HGBA1C 5.3 05/05/2015       Finally, patient has history of Vitamin D Deficiency of "30" in 2008  and last vitamin D was  Lab Results  Component Value Date   VD25OH 66 05/05/2015   Current Outpatient Prescriptions on File Prior to Visit  Medication Sig  . ALPRAZolam (XANAX) 1 MG tablet Take 1 tablet (1 mg total) by mouth 3 (three) times daily as needed for sleep.  . bisoprolol (ZEBETA) 5 MG tablet TAKE 1 TABLET ONCE DAILY.  . finasteride (PROSCAR) 5 MG tablet Take 5 mg by mouth daily. In AM  . losartan (COZAAR)  100 MG tablet TAKE 1/2 TO 1 TABLET DAILY FOR BLOOD PRESSURE.  . montelukast (SINGULAIR) 10 MG tablet TAKE 1 TABLET ONCE A DAY FOR ALLERGIES.  Marland Kitchen predniSONE (DELTASONE) 20 MG tablet 2 tablets daily for 3 days  . ranitidine (ZANTAC) 300 MG tablet TAKE ONE TABLET AT BEDTIME.  . verapamil (CALAN-SR) 240 MG CR tablet TAKE 1 TABLET TWICE DAILY AFTER MEALS FOR HEART RHYTHM.   No current facility-administered medications on file prior to visit.    Allergies  Allergen Reactions  . Demerol [Meperidine]   . Penicillins    Past Medical History:  Diagnosis Date  . ASHD (arteriosclerotic heart disease)   . Hypertension   . Prediabetes   . Vitamin D deficiency    Health Maintenance  Topic Date Due  . TETANUS/TDAP  08/08/2013  . PNA vac Low Risk Adult (2 of 2 - PCV13) 10/27/2015  . INFLUENZA VACCINE  03/08/2016  . ZOSTAVAX  Completed   Immunization History  Administered Date(s) Administered  . Influenza Split 06/03/2013  . Influenza, High Dose Seasonal PF 05/20/2014, 05/05/2015  . Pneumococcal Polysaccharide-23 10/27/2014  . Td 08/09/2003  . Zoster 08/25/2010   Past Surgical History:  Procedure Laterality Date  . CATARACT EXTRACTION W/ INTRAOCULAR LENS IMPLANT Right 1993  .  CATARACT EXTRACTION W/ INTRAOCULAR LENS IMPLANT Left 2001  . CHOLECYSTECTOMY  2001  . COLECTOMY  1992   Family History  Problem Relation Age of Onset  . AAA (abdominal aortic aneurysm) Mother   . Heart disease Mother   . Hypertension Mother   . Heart disease Father   . Heart disease Sister   . Early death Brother     at birth   Social History   Social History  . Marital status: Widowed    Spouse name: N/A  . Number of children: N/A  . Years of education: N/A   Occupational History  . Not on file.   Social History Main Topics  . Smoking status: Former Smoker    Types: Cigars    Quit date: 06/23/2014  . Smokeless tobacco: Not on file  . Alcohol use 4.2 oz/week    7 Standard drinks or equivalent  per week     Comment: occ  . Drug use: No  . Sexual activity: Not on file   Other Topics Concern  . Not on file   Social History Narrative  . No narrative on file    ROS Constitutional: Denies fever, chills, weight loss/gain, headaches, insomnia,  night sweats or change in appetite. Does c/o fatigue. Eyes: Denies redness, blurred vision, diplopia, discharge, itchy or watery eyes.  ENT: Denies discharge, congestion, post nasal drip, epistaxis, sore throat, earache, hearing loss, dental pain, Tinnitus, Vertigo, Sinus pain or snoring.  Cardio: Denies chest pain, palpitations, irregular heartbeat, syncope, dyspnea, diaphoresis, orthopnea, PND, claudication or edema Respiratory: denies cough, dyspnea, DOE, pleurisy, hoarseness, laryngitis or wheezing.  Gastrointestinal: Denies dysphagia, heartburn, reflux, water brash, pain, cramps, nausea, vomiting, bloating, diarrhea, constipation, hematemesis, melena, hematochezia, jaundice or hemorrhoids Genitourinary: Denies dysuria, frequency, urgency, nocturia, hesitancy, discharge, hematuria or flank pain Musculoskeletal: Denies arthralgia, myalgia, stiffness, Jt. Swelling, pain, limp or strain/sprain. Denies Falls. Skin: Denies puritis, rash, hives, warts, acne, eczema or change in skin lesion Neuro: No weakness, tremor, incoordination, spasms, paresthesia or pain Psychiatric: Denies confusion, memory loss or sensory loss. Denies Depression. Endocrine: Denies change in weight, skin, hair change, nocturia, and paresthesia, diabetic polys, visual blurring or hyper / hypo glycemic episodes.  Heme/Lymph: No excessive bleeding, bruising or enlarged lymph nodes.  Physical Exam  BP 136/76   Pulse 72   Temp 97.4 F (36.3 C)   Resp 16   Ht 5' 11.25" (1.81 m)   Wt 191 lb 12.8 oz (87 kg)   BMI 26.56 kg/m   General Appearance: Well nourished, in no apparent distress. Eyes: PERRLA, EOMs, conjunctiva no swelling or erythema, normal fundi and  vessels. Sinuses: No frontal/maxillary tenderness ENT/Mouth: EACs patent / TMs  nl. Nares clear without erythema, swelling, mucoid exudates. Oral hygiene is good. No erythema, swelling, or exudate. Tongue normal, non-obstructing. Tonsils not swollen or erythematous. Hearing normal.  Neck: Supple, thyroid normal. No bruits, nodes or JVD. Respiratory: Respiratory effort normal.  BS equal and clear bilateral without rales, rhonci, wheezing or stridor. Cardio: Heart sounds are normal with regular rate and rhythm and no murmurs, rubs or gallops. Peripheral pulses are normal and equal bilaterally without edema. No aortic or femoral bruits. Chest: symmetric with normal excursions and percussion.  Abdomen: Soft, with Nl bowel sounds. Nontender, no guarding, rebound, hernias, masses, or organomegaly.  Lymphatics: Non tender without lymphadenopathy.  Genitourinary: No hernias.Testes nl. DRE - prostate nl for age - smooth & firm w/o nodules. Musculoskeletal: Full ROM all peripheral extremities, joint stability, 5/5 strength, and  normal gait. Skin: Warm and dry without rashes, lesions, cyanosis, clubbing or  ecchymosis.  Neuro: Cranial nerves intact, reflexes equal bilaterally. Normal muscle tone, no cerebellar symptoms. Sensation intact.  Pysch: Alert and oriented X 3 with normal affect, insight and judgment appropriate.   Assessment and Plan  1. Essential hypertension  - Microalbumin / creatinine urine ratio - EKG 12-Lead - [place], RETROPERITNL ABD,  LTD - TSH  2. Hyperlipidemia  - Microalbumin / creatinine urine ratio - EKG 12-Lead - [place], RETROPERITNL ABD,  LTD - Lipid panel - TSH  3. Prediabetes  - Microalbumin / creatinine urine ratio - Hemoglobin A1c - Insulin, random  4. Vitamin D deficiency  - VITAMIN D 25 Hydroxy   5. Cerebral infarction due to occlusion of other cerebral artery ([place])   6. Gastroesophageal reflux disease   7. ASHD (arteriosclerotic heart disease)  - EKG  12-Lead  8. Encounter for screening for malignant neoplasm of rectum  - POC Hemoccult Bld/Stl   9. Prostate cancer screening  - PSA  10. Screening for AAA (aortic abdominal aneurysm)  - [place], RETROPERITNL ABD,  LTD  11. Screening for ischemic heart disease  - EKG 12-Lead  12. Medication management  - Urinalysis, Routine w reflex microscopic  - CBC with Differential/Platelet - BASIC METABOLIC PANEL WITH GFR - Hepatic function panel - Magnesium  13. Bladder neck obstruction  - PSA   Continue prudent diet as discussed, weight control, BP monitoring, regular exercise, and medications as discussed.  Discussed med effects and SE's. Routine screening labs and tests as requested with regular follow-up as recommended. Over 40 minutes of exam, counseling, chart review and high complex critical decision making was performed

## 2016-03-02 NOTE — Patient Instructions (Signed)

## 2016-03-03 LAB — URINALYSIS, ROUTINE W REFLEX MICROSCOPIC
Bilirubin Urine: NEGATIVE
GLUCOSE, UA: NEGATIVE
HGB URINE DIPSTICK: NEGATIVE
Ketones, ur: NEGATIVE
NITRITE: NEGATIVE
PH: 6 (ref 5.0–8.0)
PROTEIN: NEGATIVE
Specific Gravity, Urine: 1.024 (ref 1.001–1.035)

## 2016-03-03 LAB — URINALYSIS, MICROSCOPIC ONLY
Bacteria, UA: NONE SEEN [HPF]
Casts: NONE SEEN [LPF]
YEAST: NONE SEEN [HPF]

## 2016-03-03 LAB — MICROALBUMIN / CREATININE URINE RATIO
Creatinine, Urine: 349 mg/dL (ref 20–370)
MICROALB/CREAT RATIO: 4 ug/mg{creat} (ref <30)
Microalb, Ur: 1.4 mg/dL

## 2016-03-03 LAB — VITAMIN D 25 HYDROXY (VIT D DEFICIENCY, FRACTURES): VIT D 25 HYDROXY: 55 ng/mL (ref 30–100)

## 2016-03-03 LAB — INSULIN, RANDOM: Insulin: 12.2 u[IU]/mL (ref 2.0–19.6)

## 2016-03-03 LAB — PSA: PSA: 1.61 ng/mL (ref <=4.00)

## 2016-05-02 ENCOUNTER — Other Ambulatory Visit: Payer: Self-pay | Admitting: Physician Assistant

## 2016-06-06 ENCOUNTER — Encounter: Payer: Self-pay | Admitting: Internal Medicine

## 2016-06-06 ENCOUNTER — Ambulatory Visit (INDEPENDENT_AMBULATORY_CARE_PROVIDER_SITE_OTHER): Payer: Medicare Other | Admitting: Internal Medicine

## 2016-06-06 VITALS — BP 128/66 | HR 63 | Temp 98.1°F | Resp 14 | Ht 71.25 in | Wt 192.8 lb

## 2016-06-06 DIAGNOSIS — K219 Gastro-esophageal reflux disease without esophagitis: Secondary | ICD-10-CM

## 2016-06-06 DIAGNOSIS — Z6826 Body mass index (BMI) 26.0-26.9, adult: Secondary | ICD-10-CM

## 2016-06-06 DIAGNOSIS — E782 Mixed hyperlipidemia: Secondary | ICD-10-CM

## 2016-06-06 DIAGNOSIS — E559 Vitamin D deficiency, unspecified: Secondary | ICD-10-CM | POA: Diagnosis not present

## 2016-06-06 DIAGNOSIS — F329 Major depressive disorder, single episode, unspecified: Secondary | ICD-10-CM

## 2016-06-06 DIAGNOSIS — Z0000 Encounter for general adult medical examination without abnormal findings: Secondary | ICD-10-CM

## 2016-06-06 DIAGNOSIS — Z0001 Encounter for general adult medical examination with abnormal findings: Secondary | ICD-10-CM

## 2016-06-06 DIAGNOSIS — R7303 Prediabetes: Secondary | ICD-10-CM

## 2016-06-06 DIAGNOSIS — I6359 Cerebral infarction due to unspecified occlusion or stenosis of other cerebral artery: Secondary | ICD-10-CM

## 2016-06-06 DIAGNOSIS — I10 Essential (primary) hypertension: Secondary | ICD-10-CM

## 2016-06-06 DIAGNOSIS — Z23 Encounter for immunization: Secondary | ICD-10-CM | POA: Diagnosis not present

## 2016-06-06 DIAGNOSIS — R6889 Other general symptoms and signs: Secondary | ICD-10-CM

## 2016-06-06 DIAGNOSIS — Z79899 Other long term (current) drug therapy: Secondary | ICD-10-CM

## 2016-06-06 DIAGNOSIS — F0150 Vascular dementia without behavioral disturbance: Secondary | ICD-10-CM | POA: Diagnosis not present

## 2016-06-06 DIAGNOSIS — I2510 Atherosclerotic heart disease of native coronary artery without angina pectoris: Secondary | ICD-10-CM | POA: Diagnosis not present

## 2016-06-06 MED ORDER — SERTRALINE HCL 50 MG PO TABS: 50.0000 mg | ORAL_TABLET | Freq: Every day | ORAL | 2 refills | Status: DC

## 2016-06-06 NOTE — Patient Instructions (Signed)
Please start taking zyrtec daily to help stop the fluid building up behind the ears.   Please start taking a half tablet of sertraline daily.  It does not matter what time of the day you take it but try to take it at the same time every day.   If he does well with a half tablet for 2 weeks please increase to a full tablet.  We will reevaluate after 6 weeks here in the office.

## 2016-06-06 NOTE — Addendum Note (Signed)
Addended by: Elsie Amis D on: 06/06/2016 12:50 PM   Modules accepted: Orders

## 2016-06-06 NOTE — Progress Notes (Signed)
MEDICARE ANNUAL WELLNESS VISIT AND FOLLOW UP Assessment:    1. ASHD (arteriosclerotic heart disease) -cont BP control -not on statin secondary to age -declines use of aspirin   2. Essential hypertension -cont meds -well controlled -dash diet -exercise as tolerated -avoid ASA as history of hemorrhagic stroke  3. Gastroesophageal reflux disease, esophagitis presence not specified -zantac prn -avoid trigger foods  4. Cerebral infarction due to occlusion of other cerebral artery (HCC) -historical -cont BP control -avoid asa due to hemorrhagic stroke  5. BMI 26.0-26.9,adult -well controlled  6. Encounter for general adult medical examination with abnormal findings -due next year for medicare wellness  7. Hyperlipidemia -cont diet and exercise  8. Medication management -monitor labs biyearly  9. Prediabetes -cont diet and exercise  10. Vitamin D deficiency -cont Vit D supplement  11. Vascular dementia without behavioral disturbance -patient does not want to take any medications -discussed with son that this will continue to get worse -discussed reading the 36 hour day -continue to write stuff down -if difficulty with medications we can get home health out there to help  12. Reactive depression -start zoloft and taper up to 50 mg over the next 6 weeks -recheck in 6 weeks     Over 30 minutes of exam, counseling, chart review, and critical decision making was performed  Future Appointments Date Time Provider [place]  07/19/2016 3:45 PM Starlyn Skeans, PA-C [facility] None  09/12/2016 10:30 AM Unk Pinto, MD [facility] None  04/04/2017 10:00 AM Unk Pinto, MD [facility] None     Plan:   During the course of the visit the patient was educated and counseled about appropriate screening and preventive services including:    Pneumococcal vaccine   Influenza vaccine  Prevnar 13  Td vaccine  Screening electrocardiogram  Colorectal  cancer screening  Diabetes screening  Glaucoma screening  Nutrition counseling    Subjective:  Joseph Cherry is a 82 y.o. male who presents for Medicare Annual Wellness Visit and 3 month follow up for HTN, hyperlipidemia, prediabetes, and vitamin D Def.   His blood pressure has been controlled at home, today their BP is BP: 128/66 He does workout. He denies chest pain, shortness of breath, dizziness.  He is on cholesterol medication and denies myalgias. His cholesterol is at goal. The cholesterol last visit was:   Lab Results  Component Value Date   CHOL 143 03/02/2016   HDL 52 03/02/2016   LDLCALC 71 03/02/2016   TRIG 100 03/02/2016   CHOLHDL 2.8 03/02/2016   : Blood sugars have been well controlled with diet and exercise.  He does walk daily.  Lab Results  Component Value Date   HGBA1C 5.1 03/02/2016   Last GFR Lab Results  Component Value Date   GFRNONAA 52 (L) 03/02/2016     Lab Results  Component Value Date   GFRAA 60 03/02/2016   Patient is on Vitamin D supplement.   Lab Results  Component Value Date   VD25OH 66 03/02/2016     Patient is here with his son today.  He is concerned that he is grinding his teeth pretty regularly.  He reports that he is concerned that this is secondary to his anxiety and depression. He is not nearly as cheerful as he used to be.  He no longer drives and is dependent on others. His son reports that he feels like he is spending too much time alone.  He would like to seek a possible mild treatment for depression.  He is also have issues with his memory.  He is continuing to get mildly worse.  The family reports that they keep a calendar and also keep a notepad.  He does make some notes to himself which nobody else can see.  He is taking his medication with a pill dosage.    Medication Review: Current Outpatient Prescriptions on File Prior to Visit  Medication Sig Dispense Refill  . ALPRAZolam (XANAX) 1 MG tablet Take 1 tablet (1 mg  total) by mouth 3 (three) times daily as needed for sleep. 90 tablet 0  . bisoprolol (ZEBETA) 5 MG tablet TAKE 1 TABLET ONCE DAILY. 90 tablet 1  . finasteride (PROSCAR) 5 MG tablet Take 5 mg by mouth daily. In AM    . losartan (COZAAR) 100 MG tablet TAKE 1/2 TO 1 TABLET DAILY FOR BLOOD PRESSURE. 90 tablet 0  . montelukast (SINGULAIR) 10 MG tablet TAKE 1 TABLET ONCE A DAY FOR ALLERGIES. 30 tablet PRN  . ranitidine (ZANTAC) 300 MG tablet TAKE ONE TABLET AT BEDTIME. 90 tablet 1  . verapamil (CALAN-SR) 240 MG CR tablet TAKE 1 TABLET TWICE DAILY AFTER MEALS FOR HEART RHYTHM. 180 tablet 1   No current facility-administered medications on file prior to visit.     Allergies: Allergies  Allergen Reactions  . Demerol [Meperidine]   . Penicillins     Current Problems (verified) has Hypertension; Prediabetes; ASHD (arteriosclerotic heart disease); Vitamin D deficiency; Hyperlipidemia; Medication management; CVA, Rt Temporal/Occipital Hemorrhagic (11/2013) ; GERD ; BMI 26.0-26.9,adult; and Encounter for general adult medical examination with abnormal findings on his problem list.  Screening Tests Immunization History  Administered Date(s) Administered  . Influenza Split 06/03/2013  . Influenza, High Dose Seasonal PF 05/20/2014, 05/05/2015  . Pneumococcal Conjugate-13 03/02/2016  . Pneumococcal Polysaccharide-23 10/27/2014  . Td 08/09/2003  . Zoster 08/25/2010    Preventative care: Last colonoscopy: remote, aged out  Prior vaccinations: TD or Tdap: 2005, declined due to cost  Influenza: 2017  Pneumococcal: 2016 Prevnar13: 2017 Shingles/Zostavax: 2012  Names of Other Physician/Practitioners you currently use: 1. [facility] here for primary care 2. Herbert Deaner, eye doctor, last visit Goes yearly, due soon 3. Does not see currently but due to recent bruxism recommend he goes  Patient Care Team: Unk Pinto, MD as PCP - General (Internal  Medicine) Carolan Clines, MD as Consulting Physician (Urology)  Surgical: He  has a past surgical history that includes Colectomy (1992); Cataract extraction w/ intraocular lens implant (Right, 1993); Cholecystectomy (2001); and Cataract extraction w/ intraocular lens implant (Left, 2001). Family His family history includes AAA (abdominal aortic aneurysm) in his mother; Early death in his brother; Heart disease in his father, mother, and sister; Hypertension in his mother. Social history  He reports that he quit smoking about 1 years ago. His smoking use included Cigars. He does not have any smokeless tobacco history on file. He reports that he drinks about 4.2 oz of alcohol per week . He reports that he does not use drugs.  MEDICARE WELLNESS OBJECTIVES: Physical activity: Current Exercise Habits: Home exercise routine, Time (Minutes): 30, Intensity: Mild Cardiac risk factors: Cardiac Risk Factors include: advanced age (>61men, >87 women);diabetes mellitus;dyslipidemia;hypertension;male gender Depression/mood screen:   Depression screen [facility] 2/9 03/02/2016  Decreased Interest 0  Down, Depressed, Hopeless 0  PHQ - 2 Score 0    ADLs:  In your present state of health, do you have any difficulty performing the following activities: 06/06/2016 03/02/2016  Hearing? N  N  Vision? N N  Difficulty concentrating or making decisions? Y N  Walking or climbing stairs? N N  Dressing or bathing? N N  Doing errands, shopping? N N  Preparing Food and eating ? Y -  Using the Toilet? N -  In the past six months, have you accidently leaked urine? N -  Do you have problems with loss of bowel control? N -  Managing your Medications? Y -  Managing your Finances? Y -  Housekeeping or managing your Housekeeping? Y -  Some recent data might be hidden     Cognitive Testing  Alert? Yes  Normal Appearance?Yes  Oriented to person? Yes  Place? Yes   Time? Yes  Recall of three objects?  Yes  Can perform  simple calculations? Yes  Displays appropriate judgment?Yes  Can read the correct time from a watch face?Yes  EOL planning:     Objective:   Today's Vitals   06/06/16 1054  BP: 128/66  Pulse: 63  Resp: 14  Temp: 98.1 F (36.7 C)  SpO2: 97%  Weight: 192 lb 12.8 oz (87.5 kg)  Height: 5' 11.25" (1.81 m)   Body mass index is 26.7 kg/m.  General appearance: alert, no distress, WD/WN, male HEENT: normocephalic, sclerae anicteric, TMs pearly, nares patent, no discharge or erythema, pharynx normal Oral cavity: MMM, no lesions Neck: supple, no lymphadenopathy, no thyromegaly, no masses Heart: RRR, normal S1, S2, no murmurs Lungs: CTA bilaterally, no wheezes, rhonchi, or rales Abdomen: +bs, soft, non tender, non distended, no masses, no hepatomegaly, no splenomegaly Musculoskeletal: nontender, no swelling, no obvious deformity Extremities: no edema, no cyanosis, no clubbing Pulses: 2+ symmetric, upper and lower extremities, normal cap refill Neurological: alert, oriented x 3, CN2-12 intact, strength normal upper extremities and lower extremities, sensation normal throughout, DTRs 2+ throughout, no cerebellar signs, gait normal Psychiatric: normal affect, behavior normal, pleasant   Medicare Attestation I have personally reviewed: The patient's medical and social history Their use of alcohol, tobacco or illicit drugs Their current medications and supplements The patient's functional ability including ADLs,fall risks, home safety risks, cognitive, and hearing and visual impairment Diet and physical activities Evidence for depression or mood disorders  The patient's weight, height, BMI, and visual acuity have been recorded in the chart.  I have made referrals, counseling, and provided education to the patient based on review of the above and I have provided the patient with a written personalized care plan for preventive services.     Starlyn Skeans, PA-C   06/06/2016

## 2016-06-15 DIAGNOSIS — C61 Malignant neoplasm of prostate: Secondary | ICD-10-CM | POA: Diagnosis not present

## 2016-06-15 DIAGNOSIS — R3913 Splitting of urinary stream: Secondary | ICD-10-CM | POA: Diagnosis not present

## 2016-06-27 ENCOUNTER — Other Ambulatory Visit: Payer: Self-pay | Admitting: Internal Medicine

## 2016-07-04 ENCOUNTER — Other Ambulatory Visit: Payer: Self-pay | Admitting: Internal Medicine

## 2016-07-19 ENCOUNTER — Ambulatory Visit (INDEPENDENT_AMBULATORY_CARE_PROVIDER_SITE_OTHER): Payer: Medicare Other | Admitting: Internal Medicine

## 2016-07-19 ENCOUNTER — Encounter: Payer: Self-pay | Admitting: Internal Medicine

## 2016-07-19 VITALS — BP 138/66 | HR 66 | Temp 98.2°F | Resp 16 | Ht 71.25 in | Wt 187.0 lb

## 2016-07-19 DIAGNOSIS — F458 Other somatoform disorders: Secondary | ICD-10-CM | POA: Diagnosis not present

## 2016-07-19 DIAGNOSIS — F411 Generalized anxiety disorder: Secondary | ICD-10-CM | POA: Diagnosis not present

## 2016-07-19 DIAGNOSIS — I2510 Atherosclerotic heart disease of native coronary artery without angina pectoris: Secondary | ICD-10-CM | POA: Diagnosis not present

## 2016-07-19 MED ORDER — SERTRALINE HCL 50 MG PO TABS: 50.0000 mg | ORAL_TABLET | Freq: Every day | ORAL | 2 refills | Status: DC

## 2016-07-19 NOTE — Progress Notes (Signed)
Assessment and Plan:   1. Bruxism -improved after zoloft 25 mg -cont medications  2. Generalized anxiety disorder -cont zoloft 25 mg -patient no longer taking xanax regularly -patient encouraged to take the xanax only if he is on the edge of a panic attack or extremely irritable.      HPI 82 y.o.male presents for 6 weeks follow up of teeth grinding and anxiety.  He was started on zoloft at last visit.  Xanax was discontinued.  Son notes that teeth grinding has stopped.  No apparent side effects.  No other complaints.  He is going out more and doing more things.  He is not quite as "mean".   Past Medical History:  Diagnosis Date  . ASHD (arteriosclerotic heart disease)   . Hypertension   . Prediabetes   . Vitamin D deficiency      Allergies  Allergen Reactions  . Demerol [Meperidine]   . Penicillins       Current Outpatient Prescriptions on File Prior to Visit  Medication Sig Dispense Refill  . bisoprolol (ZEBETA) 5 MG tablet TAKE 1 TABLET ONCE DAILY. 90 tablet 1  . finasteride (PROSCAR) 5 MG tablet Take 5 mg by mouth daily. In AM    . losartan (COZAAR) 100 MG tablet TAKE 1/2 TO 1 TABLET DAILY FOR BLOOD PRESSURE. 90 tablet 0  . montelukast (SINGULAIR) 10 MG tablet TAKE 1 TABLET ONCE A DAY FOR ALLERGIES. 30 tablet PRN  . ranitidine (ZANTAC) 300 MG tablet TAKE ONE TABLET AT BEDTIME. 90 tablet 1  . verapamil (CALAN-SR) 240 MG CR tablet TAKE 1 TABLET TWICE DAILY AFTER MEALS FOR HEART RHYTHM. 180 tablet 1   No current facility-administered medications on file prior to visit.     ROS: all negative except above.   Physical Exam: Filed Weights   07/19/16 1527  Weight: 187 lb (84.8 kg)   BP 138/66   Pulse 66   Temp 98.2 F (36.8 C) (Temporal)   Resp 16   Ht 5' 11.25" (1.81 m)   Wt 187 lb (84.8 kg)   BMI 25.90 kg/m  General Appearance: Well developed well nourished, non-toxic appearing in no apparent distress. Eyes: PERRLA, EOMs, conjunctiva w/ no swelling or  erythema or discharge Sinuses: No Frontal/maxillary tenderness ENT/Mouth: Ear canals clear without swelling or erythema.  TM's normal bilaterally with no retractions, bulging, or loss of landmarks.   Neck: Supple, thyroid normal, no notable JVD  Respiratory: Respiratory effort normal, Clear breath sounds anteriorly and posteriorly bilaterally without rales, rhonchi, wheezing or stridor. No retractions or accessory muscle usage. Cardio: RRR with no MRGs.   Abdomen: Soft, + BS.  Non tender, no guarding, rebound, hernias, masses.  Musculoskeletal: Full ROM, 5/5 strength, normal gait.  Skin: Warm, dry without rashes  Neuro: Awake and oriented X 3, Cranial nerves intact. Normal muscle tone, no cerebellar symptoms. Sensation intact.  Psych: normal affect, Insight and Judgment appropriate.     Starlyn Skeans, PA-C 4:24 PM [facility]

## 2016-09-05 ENCOUNTER — Other Ambulatory Visit: Payer: Self-pay | Admitting: Internal Medicine

## 2016-09-12 ENCOUNTER — Encounter: Payer: Self-pay | Admitting: Internal Medicine

## 2016-09-12 ENCOUNTER — Ambulatory Visit (INDEPENDENT_AMBULATORY_CARE_PROVIDER_SITE_OTHER): Payer: Medicare Other | Admitting: Internal Medicine

## 2016-09-12 VITALS — BP 140/64 | HR 60 | Temp 97.5°F | Resp 16 | Ht 71.25 in | Wt 186.0 lb

## 2016-09-12 DIAGNOSIS — R7303 Prediabetes: Secondary | ICD-10-CM | POA: Diagnosis not present

## 2016-09-12 DIAGNOSIS — Z79899 Other long term (current) drug therapy: Secondary | ICD-10-CM | POA: Diagnosis not present

## 2016-09-12 DIAGNOSIS — E559 Vitamin D deficiency, unspecified: Secondary | ICD-10-CM | POA: Diagnosis not present

## 2016-09-12 DIAGNOSIS — E782 Mixed hyperlipidemia: Secondary | ICD-10-CM | POA: Diagnosis not present

## 2016-09-12 DIAGNOSIS — I2510 Atherosclerotic heart disease of native coronary artery without angina pectoris: Secondary | ICD-10-CM

## 2016-09-12 DIAGNOSIS — I10 Essential (primary) hypertension: Secondary | ICD-10-CM

## 2016-09-12 LAB — CBC WITH DIFFERENTIAL/PLATELET
BASOS PCT: 1 %
Basophils Absolute: 59 cells/uL (ref 0–200)
EOS ABS: 295 {cells}/uL (ref 15–500)
Eosinophils Relative: 5 %
HEMATOCRIT: 40.7 % (ref 38.5–50.0)
HEMOGLOBIN: 13.6 g/dL (ref 13.2–17.1)
LYMPHS PCT: 18 %
Lymphs Abs: 1062 cells/uL (ref 850–3900)
MCH: 31.6 pg (ref 27.0–33.0)
MCHC: 33.4 g/dL (ref 32.0–36.0)
MCV: 94.7 fL (ref 80.0–100.0)
MONO ABS: 826 {cells}/uL (ref 200–950)
MPV: 9.4 fL (ref 7.5–12.5)
Monocytes Relative: 14 %
NEUTROS PCT: 62 %
Neutro Abs: 3658 cells/uL (ref 1500–7800)
Platelets: 203 10*3/uL (ref 140–400)
RBC: 4.3 MIL/uL (ref 4.20–5.80)
RDW: 13.5 % (ref 11.0–15.0)
WBC: 5.9 10*3/uL (ref 3.8–10.8)

## 2016-09-12 LAB — BASIC METABOLIC PANEL WITH GFR
BUN: 17 mg/dL (ref 7–25)
CO2: 29 mmol/L (ref 20–31)
Calcium: 8.9 mg/dL (ref 8.6–10.3)
Chloride: 104 mmol/L (ref 98–110)
Creat: 1.01 mg/dL (ref 0.70–1.11)
GFR, EST AFRICAN AMERICAN: 74 mL/min (ref >=60)
GFR, Est Non African American: 64 mL/min (ref >=60)
GLUCOSE: 78 mg/dL (ref 65–99)
POTASSIUM: 4.2 mmol/L (ref 3.5–5.3)
Sodium: 139 mmol/L (ref 135–146)

## 2016-09-12 LAB — TSH: TSH: 1.54 mIU/L (ref 0.40–4.50)

## 2016-09-12 LAB — LIPID PANEL
CHOLESTEROL: 141 mg/dL (ref <200)
HDL: 47 mg/dL (ref >40)
LDL Cholesterol: 75 mg/dL (ref <100)
TRIGLYCERIDES: 96 mg/dL (ref <150)
Total CHOL/HDL Ratio: 3 Ratio (ref <5.0)
VLDL: 19 mg/dL (ref <30)

## 2016-09-12 LAB — HEPATIC FUNCTION PANEL
ALBUMIN: 3.5 g/dL — AB (ref 3.6–5.1)
ALK PHOS: 61 U/L (ref 40–115)
ALT: 8 U/L — ABNORMAL LOW (ref 9–46)
AST: 15 U/L (ref 10–35)
Bilirubin, Direct: 0.2 mg/dL (ref <=0.2)
Indirect Bilirubin: 0.5 mg/dL (ref 0.2–1.2)
TOTAL PROTEIN: 6 g/dL — AB (ref 6.1–8.1)
Total Bilirubin: 0.7 mg/dL (ref 0.2–1.2)

## 2016-09-12 NOTE — Progress Notes (Signed)
[facility] Unk Pinto, M.D.        Uvaldo Bristle. Silverio Lay, P.A.-C       Starlyn Skeans, P.A.-C  [facility]                [address] [place] [id number] Telephone [phone] Telefax [phone] ______________________________________________________________________     This very nice 83 y.o. DWM presents for 6 month follow up with Hypertension, ASHD,  Hyperlipidemia, Pre-Diabetes and Vitamin D Deficiency.      Patient is treated for HTN  (1998) & BP has been controlled at home. Today's BP is borderline elevated at 140/64.  Patient has remote hx/o pSVT and this has been quiescent. Patient has had no complaints of any cardiac type chest pain, palpitations, dyspnea/orthopnea/PND, dizziness, claudication, or dependent edema. Patient has mild Dementia and son (here today) and daughter are very supportative monitoring him closely in his home.      Hyperlipidemia is controlled with diet & meds. Patient denies myalgias or other med SE's. Last Lipids were at goal: Lab Results  Component Value Date   CHOL 143 03/02/2016   HDL 52 03/02/2016   LDLCALC 71 03/02/2016   TRIG 100 03/02/2016   CHOLHDL 2.8 03/02/2016      Also, the patient has history of PreDiabetes (A1c 5.7% in 2011) and has had no symptoms of reactive hypoglycemia, diabetic polys, paresthesias or visual blurring.  Last A1c was at goal:  Lab Results  Component Value Date   HGBA1C 5.1 03/02/2016      Further, the patient also has history of Vitamin D Deficiency ("30" in 2008) and supplements vitamin D without any suspected side-effects. Last vitamin D was at goal: Lab Results  Component Value Date   VD25OH 55 03/02/2016   Current Outpatient Prescriptions on File Prior to Visit  Medication Sig  . bisoprolol (ZEBETA) 5 MG tablet TAKE 1 TABLET ONCE DAILY.  . finasteride (PROSCAR) 5 MG tablet Take 5 mg by mouth daily. In AM  . losartan  (COZAAR) 100 MG tablet TAKE 1/2 TO 1 TABLET DAILY FOR BLOOD PRESSURE.  . montelukast (SINGULAIR) 10 MG tablet TAKE 1 TABLET ONCE A DAY FOR ALLERGIES.  Marland Kitchen ranitidine (ZANTAC) 300 MG tablet TAKE ONE TABLET AT BEDTIME.  Marland Kitchen sertraline (ZOLOFT) 50 MG tablet Take 1 tablet (50 mg total) by mouth daily.  . verapamil (CALAN-SR) 240 MG CR tablet TAKE 1 TABLET TWICE DAILY AFTER MEALS FOR HEART RHYTHM.   No current facility-administered medications on file prior to visit.    Allergies  Allergen Reactions  . Demerol [Meperidine]   . Penicillins    PMHx:   Past Medical History:  Diagnosis Date  . ASHD (arteriosclerotic heart disease)   . Hypertension   . Prediabetes   . Vitamin D deficiency    Immunization History  Administered Date(s) Administered  . Influenza Split 06/03/2013  . Influenza, High Dose Seasonal PF 05/20/2014, 05/05/2015, 06/06/2016  . Pneumococcal Conjugate-13 03/02/2016  . Pneumococcal Polysaccharide-23 10/27/2014  . Td 08/09/2003  . Zoster 08/25/2010   Past Surgical History:  Procedure Laterality Date  . CATARACT EXTRACTION W/ INTRAOCULAR LENS IMPLANT Right 1993  . CATARACT EXTRACTION W/ INTRAOCULAR LENS IMPLANT Left 2001  . CHOLECYSTECTOMY  2001  . COLECTOMY  1992   FHx:    Reviewed / unchanged  SHx:    Reviewed / unchanged  Systems Review:  Constitutional: Denies fever, chills, wt changes, headaches, insomnia, fatigue, night sweats, change in appetite. Eyes: Denies redness, blurred vision, diplopia, discharge, itchy, watery eyes.  ENT: Denies discharge, congestion, post nasal drip, epistaxis, sore throat, earache, hearing loss, dental pain, tinnitus, vertigo, sinus pain, snoring.  CV: Denies chest pain, palpitations, irregular heartbeat, syncope, dyspnea, diaphoresis, orthopnea, PND, claudication or edema. Respiratory: denies cough, dyspnea, DOE, pleurisy, hoarseness, laryngitis, wheezing.  Gastrointestinal: Denies dysphagia, odynophagia, heartburn, reflux, water  brash, abdominal pain or cramps, nausea, vomiting, bloating, diarrhea, constipation, hematemesis, melena, hematochezia  or hemorrhoids. Genitourinary: Denies dysuria, frequency, urgency, nocturia, hesitancy, discharge, hematuria or flank pain. Musculoskeletal: Denies arthralgias, myalgias, stiffness, jt. swelling, pain, limping or strain/sprain.  Skin: Denies pruritus, rash, hives, warts, acne, eczema or change in skin lesion(s). Neuro: No weakness, tremor, incoordination, spasms, paresthesia or pain. Psychiatric: Denies confusion, memory loss or sensory loss. Endo: Denies change in weight, skin or hair change.  Heme/Lymph: No excessive bleeding, bruising or enlarged lymph nodes.  Physical Exam  BP 140/64   Pulse 60   Temp 97.5 F (36.4 C)   Resp 16   Ht 5' 11.25" (1.81 m)   Wt 186 lb (84.4 kg)   BMI 25.76 kg/m   Appears well nourished and in no distress.  Eyes: PERRLA, EOMs, conjunctiva no swelling or erythema. Sinuses: No frontal/maxillary tenderness ENT/Mouth: EAC's clear, TM's nl w/o erythema, bulging. Nares clear w/o erythema, swelling, exudates. Oropharynx clear without erythema or exudates. Oral hygiene is good. Tongue normal, non obstructing. Hearing intact.  Neck: Supple. Thyroid nl. Car 2+/2+ without bruits, nodes or JVD. Chest: Respirations nl with BS clear & equal w/o rales, rhonchi, wheezing or stridor.  Cor: Heart sounds normal w/ regular rate and rhythm without sig. murmurs, gallops, clicks, or rubs. Peripheral pulses normal and equal  without edema.  Abdomen: Soft & bowel sounds normal. Non-tender w/o guarding, rebound, hernias, masses, or organomegaly.  Lymphatics: Unremarkable.  Musculoskeletal: Full ROM all peripheral extremities, joint stability, 5/5 strength, and normal gait.  Skin: Warm, dry without exposed rashes, lesions or ecchymosis apparent.  Neuro: Cranial nerves intact, reflexes equal bilaterally. Sensory-motor testing grossly intact. Tendon reflexes  grossly intact.  Pysch: Alert & oriented x 3.  Insight and judgement nl & appropriate. No ideations.  Assessment and Plan:    1. Essential hypertension  - Continue medication, monitor blood pressure at home.  - Continue DASH diet. Reminder to go to the ER if any CP,  SOB, nausea, dizziness, severe HA, changes vision/speech,  left arm numbness and tingling and jaw pain.  - CBC with Differential/Platelet - BASIC METABOLIC PANEL WITH GFR - TSH  2. Hyperlipidemia  - Continue diet/meds, exercise,& lifestyle modifications.  - Continue monitor periodic cholesterol/liver & renal functions   - Hepatic function panel - Lipid panel - TSH  3. Prediabetes  - Continue diet, exercise, lifestyle modifications.  - Monitor appropriate labs.  - Hemoglobin A1c - Insulin, random  4. Vitamin D deficiency  - Continue supplementation. - VITAMIN D 25 Hydroxy  5. ASHD (arteriosclerotic heart disease)  6. Medication management  - CBC with Differential/Platelet - BASIC METABOLIC PANEL WITH GFR - Hepatic function panel - Magnesium - Lipid panel - VITAMIN D 25 Hydroxy        Recommended regular exercise, BP monitoring, weight control, and discussed med and SE's. Recommended labs to assess and monitor clinical status. Further disposition pending results of labs. Over 30 minutes of exam, counseling, chart review was performed

## 2016-09-12 NOTE — Patient Instructions (Signed)

## 2016-09-13 LAB — HEMOGLOBIN A1C
Hgb A1c MFr Bld: 4.9 % (ref <5.7)
MEAN PLASMA GLUCOSE: 94 mg/dL

## 2016-09-13 LAB — MAGNESIUM: MAGNESIUM: 2 mg/dL (ref 1.5–2.5)

## 2016-09-13 LAB — INSULIN, RANDOM: INSULIN: 6.4 u[IU]/mL (ref 2.0–19.6)

## 2016-09-13 LAB — VITAMIN D 25 HYDROXY (VIT D DEFICIENCY, FRACTURES): VIT D 25 HYDROXY: 59 ng/mL (ref 30–100)

## 2016-12-10 NOTE — Progress Notes (Deleted)
Patient ID: Joseph Cherry, male   DOB: 1921-11-29, 83 y.o.   MRN: [id number]  Assessment and Plan:    Continue diet and meds as discussed. Further disposition pending results of labs.  HPI 83 y.o. male  presents for 3 month follow up with hypertension, hyperlipidemia, prediabetes and vitamin D.   His blood pressure has been controlled at home, today their BP is  .   He does workout. He denies chest pain, shortness of breath, dizziness.   He is on cholesterol medication and denies myalgias. His cholesterol is at goal. The cholesterol last visit was:   Lab Results  Component Value Date   CHOL 141 09/12/2016   HDL 47 09/12/2016   LDLCALC 75 09/12/2016   TRIG 96 09/12/2016   CHOLHDL 3.0 09/12/2016    He has been working on diet and exercise for prediabetes, and denies foot ulcerations, hyperglycemia, hypoglycemia , increased appetite, nausea, paresthesia of the feet, polydipsia, polyuria, visual disturbances, vomiting and weight loss. Last A1C in the office was:  Lab Results  Component Value Date   HGBA1C 4.9 09/12/2016   Patient is on Vitamin D supplement.  Lab Results  Component Value Date   VD25OH 58 09/12/2016     He and his son are worried about his memory.  They report that he has issues with short term memory. They also report that he seems to have a hard time engaging in activities with others.   Current Medications:  Current Outpatient Prescriptions on File Prior to Visit  Medication Sig Dispense Refill  . bisoprolol (ZEBETA) 5 MG tablet TAKE 1 TABLET ONCE DAILY. 90 tablet 1  . finasteride (PROSCAR) 5 MG tablet Take 5 mg by mouth daily. In AM    . losartan (COZAAR) 100 MG tablet TAKE 1/2 TO 1 TABLET DAILY FOR BLOOD PRESSURE. 90 tablet 0  . montelukast (SINGULAIR) 10 MG tablet TAKE 1 TABLET ONCE A DAY FOR ALLERGIES. 30 tablet PRN  . ranitidine (ZANTAC) 300 MG tablet TAKE ONE TABLET AT BEDTIME. 90 tablet 1  . sertraline (ZOLOFT) 50 MG tablet Take 1 tablet (50 mg total) by  mouth daily. 30 tablet 2  . verapamil (CALAN-SR) 240 MG CR tablet TAKE 1 TABLET TWICE DAILY AFTER MEALS FOR HEART RHYTHM. 180 tablet 0   No current facility-administered medications on file prior to visit.     Medical History:  Past Medical History:  Diagnosis Date  . ASHD (arteriosclerotic heart disease)   . Hypertension   . Prediabetes   . Vitamin D deficiency     Allergies:  Allergies  Allergen Reactions  . Demerol [Meperidine]   . Penicillins      Review of Systems:  Review of Systems  Constitutional: Negative for chills, fever and malaise/fatigue.  HENT: Negative for congestion, ear pain and sore throat.   Eyes: Negative.   Respiratory: Negative for cough, shortness of breath and wheezing.   Cardiovascular: Negative for chest pain, palpitations and leg swelling.  Gastrointestinal: Negative for blood in stool, constipation, diarrhea and melena.  Genitourinary: Negative.   Skin: Negative.   Neurological: Negative for dizziness, sensory change, loss of consciousness and headaches.  Psychiatric/Behavioral: Negative.  Negative for depression. The patient is not nervous/anxious and does not have insomnia.     Family history- Review and unchanged  Social history- Review and unchanged  Physical Exam: There were no vitals taken for this visit. Wt Readings from Last 3 Encounters:  09/12/16 186 lb (84.4 kg)  07/19/16 187 lb (  84.8 kg)  06/06/16 192 lb 12.8 oz (87.5 kg)    General Appearance: Well nourished well developed, in no apparent distress. Eyes: PERRLA, EOMs, conjunctiva no swelling or erythema ENT/Mouth: Ear canals normal without obstruction, swelling, erythma, discharge.  TMs normal bilaterally.  Oropharynx moist, clear, without exudate, or postoropharyngeal swelling. Neck: Supple, thyroid normal,no cervical adenopathy  Respiratory: Respiratory effort normal, Breath sounds clear A&P without rhonchi, wheeze, or rale.  No retractions, no accessory usage. Cardio:  RRR with no MRGs. Brisk peripheral pulses without edema.  Abdomen: Soft, + BS,  Non tender, no guarding, rebound, hernias, masses. Musculoskeletal: Full ROM, 5/5 strength, Normal gait Skin: Warm, dry without rashes, lesions, ecchymosis.  Neuro: Awake and oriented X 3, Cranial nerves intact. Normal muscle tone, no cerebellar symptoms. Psych: Normal affect, some repetition of stories, poor short term medication. Insight and Judgment appropriate.    Vicie Mutters, PA-C 8:39 AM [facility]

## 2016-12-12 ENCOUNTER — Ambulatory Visit: Payer: Self-pay | Admitting: Physician Assistant

## 2017-01-04 ENCOUNTER — Other Ambulatory Visit: Payer: Self-pay | Admitting: Internal Medicine

## 2017-02-09 ENCOUNTER — Encounter: Payer: Self-pay | Admitting: Internal Medicine

## 2017-02-15 ENCOUNTER — Other Ambulatory Visit: Payer: Self-pay | Admitting: Internal Medicine

## 2017-02-21 DIAGNOSIS — Z85820 Personal history of malignant melanoma of skin: Secondary | ICD-10-CM | POA: Diagnosis not present

## 2017-02-21 DIAGNOSIS — L570 Actinic keratosis: Secondary | ICD-10-CM | POA: Diagnosis not present

## 2017-02-21 DIAGNOSIS — Z85828 Personal history of other malignant neoplasm of skin: Secondary | ICD-10-CM | POA: Diagnosis not present

## 2017-02-21 DIAGNOSIS — D485 Neoplasm of uncertain behavior of skin: Secondary | ICD-10-CM | POA: Diagnosis not present

## 2017-02-21 DIAGNOSIS — D2271 Melanocytic nevi of right lower limb, including hip: Secondary | ICD-10-CM | POA: Diagnosis not present

## 2017-02-21 DIAGNOSIS — L821 Other seborrheic keratosis: Secondary | ICD-10-CM | POA: Diagnosis not present

## 2017-02-21 DIAGNOSIS — D1801 Hemangioma of skin and subcutaneous tissue: Secondary | ICD-10-CM | POA: Diagnosis not present

## 2017-02-21 DIAGNOSIS — D225 Melanocytic nevi of trunk: Secondary | ICD-10-CM | POA: Diagnosis not present

## 2017-04-03 NOTE — Patient Instructions (Signed)

## 2017-04-03 NOTE — Progress Notes (Signed)
[facility]   Unk Pinto, M.D.      Uvaldo Bristle. Silverio Lay, P.A.-C [facility]                [address] [place], [place] [address] Telephone [phone] Telefax [phone] Comprehensive Evaluation & Examination     This very nice 83 y.o. D/WWM presents for a  comprehensive evaluation and management of multiple medical co-morbidities.  Patient has been followed for HTN, Prediabetes, Hyperlipidemia and Vitamin D Deficiency. Patient had GERD controlled w/ Zantac. Patient has mild Dementia and is monitored closely in his home by his son & daughter.      HTN predates since 1998. Patient's BP has been controlled at home.  Today's BP is at goal - 138/86. Patient also has a very remote hx/o pSVT with no palpitations on his current med regimen. Patient denies any cardiac symptoms as chest pain,  shortness of breath, dizziness or ankle swelling.     Patient's hyperlipidemia is controlled with diet. Last lipids were at goal: Lab Results  Component Value Date   CHOL 141 09/12/2016   HDL 47 09/12/2016   LDLCALC 75 09/12/2016   TRIG 96 09/12/2016   CHOLHDL 3.0 09/12/2016      Patient has prediabetes with A1c 5.7% in 2011 and patient denies reactive hypoglycemic symptoms, visual blurring, diabetic polys or paresthesias. Last A1c was at goal for the last few years: Lab Results  Component Value Date   HGBA1C 4.9 09/12/2016       Finally, patient has history of Vitamin D Deficiency of  "30" in 2008 and last vitamin D was at goal: Lab Results  Component Value Date   VD25OH 59 09/12/2016   Current Outpatient Prescriptions on File Prior to Visit  Medication Sig  . bisoprolol (ZEBETA) 5 MG tablet TAKE 1 TABLET ONCE DAILY.  . finasteride (PROSCAR) 5 MG tablet Take 5 mg by mouth daily. In AM  . losartan (COZAAR) 100 MG tablet TAKE 1/2 TO 1 TABLET DAILY FOR BLOOD PRESSURE.  . montelukast (SINGULAIR) 10 MG tablet  TAKE 1 TABLET ONCE A DAY FOR ALLERGIES.  Marland Kitchen ranitidine (ZANTAC) 300 MG tablet TAKE ONE TABLET AT BEDTIME.  Marland Kitchen sertraline (ZOLOFT) 50 MG tablet Take 1 tablet (50 mg total) by mouth daily.  . verapamil (CALAN-SR) 240 MG CR tablet TAKE 1 TABLET TWICE DAILY AFTER MEALS FOR HEART RHYTHM.   No current facility-administered medications on file prior to visit.    Allergies  Allergen Reactions  . Demerol [Meperidine]   . Penicillins    Past Medical History:  Diagnosis Date  . ASHD (arteriosclerotic heart disease)   . Hypertension   . Prediabetes   . Vitamin D deficiency    Health Maintenance  Topic Date Due  . TETANUS/TDAP  08/08/2013  . INFLUENZA VACCINE  03/08/2017  . PNA vac Low Risk Adult  Completed   Immunization History  Administered Date(s) Administered  . Influenza Split 06/03/2013  . Influenza, High Dose Seasonal PF 05/20/2014, 05/05/2015, 06/06/2016  . Pneumococcal Conjugate-13 03/02/2016  . Pneumococcal Polysaccharide-23 10/27/2014  . Td 08/09/2003  . Zoster 08/25/2010   Past Surgical History:  Procedure Laterality Date  . CATARACT EXTRACTION W/ INTRAOCULAR LENS IMPLANT Right 1993  . CATARACT EXTRACTION W/ INTRAOCULAR LENS IMPLANT Left 2001  . CHOLECYSTECTOMY  2001  . COLECTOMY  1992   Family History  Problem  Relation Age of Onset  . AAA (abdominal aortic aneurysm) Mother   . Heart disease Mother   . Hypertension Mother   . Heart disease Father   . Heart disease Sister   . Early death Brother        at birth   Social History   Social History  . Marital status: Widowed    Spouse name: N/A  . Number of children: N/A  . Years of education: N/A   Occupational History  . Not on file.   Social History Main Topics  . Smoking status: Former Smoker    Types: Cigars    Quit date: 06/23/2014  . Smokeless tobacco: Never Used  . Alcohol use 4.2 oz/week    7 Standard drinks or equivalent per week     Comment: occ  . Drug use: No  . Sexual activity: Not on file    Other Topics Concern  . Not on file   Social History Narrative  . No narrative on file    ROS Constitutional: Denies fever, chills, weight loss/gain, headaches, insomnia,  night sweats or change in appetite. Does c/o fatigue. Eyes: Denies redness, blurred vision, diplopia, discharge, itchy or watery eyes.  ENT: Denies discharge, congestion, post nasal drip, epistaxis, sore throat, earache, hearing loss, dental pain, Tinnitus, Vertigo, Sinus pain or snoring.  Cardio: Denies chest pain, palpitations, irregular heartbeat, syncope, dyspnea, diaphoresis, orthopnea, PND, claudication or edema Respiratory: denies cough, dyspnea, DOE, pleurisy, hoarseness, laryngitis or wheezing.  Gastrointestinal: Denies dysphagia, heartburn, reflux, water brash, pain, cramps, nausea, vomiting, bloating, diarrhea, constipation, hematemesis, melena, hematochezia, jaundice or hemorrhoids Genitourinary: Denies dysuria, frequency, urgency, nocturia, hesitancy, discharge, hematuria or flank pain Musculoskeletal: Denies arthralgia, myalgia, stiffness, Jt. Swelling, pain, limp or strain/sprain. Denies Falls. Skin: Denies puritis, rash, hives, warts, acne, eczema or change in skin lesion Neuro: No weakness, tremor, incoordination, spasms, paresthesia or pain Psychiatric: Denies confusion, memory loss or sensory loss. Denies Depression. Endocrine: Denies change in weight, skin, hair change, nocturia, and paresthesia, diabetic polys, visual blurring or hyper / hypo glycemic episodes.  Heme/Lymph: No excessive bleeding, bruising or enlarged lymph nodes.  Physical Exam  BP 138/86   Pulse 72   Temp (!) 97.3 F (36.3 C)   Ht 5' 11.25" (1.81 m)   Wt 181 lb (82.1 kg)   SpO2 97%   BMI 25.07 kg/m   General Appearance: Well nourished and well groomed elderly male in no apparent distress.  Eyes: PERRLA, EOMs, conjunctiva no swelling or erythema, normal fundi and vessels. Sinuses: No frontal/maxillary  tenderness ENT/Mouth: EACs patent / TMs  nl. Nares clear without erythema, swelling, mucoid exudates. Oral hygiene is good. No erythema, swelling, or exudate. Tongue normal, non-obstructing. Tonsils not swollen or erythematous. Hearing normal.  Neck: Supple, thyroid normal. No bruits, nodes or JVD. Respiratory: Respiratory effort normal.  BS equal and clear bilateral without rales, rhonci, wheezing or stridor. Cardio: Heart sounds are normal with regular rate and rhythm and no murmurs, rubs or gallops. Peripheral pulses are normal and equal bilaterally without edema. No aortic or femoral bruits. Chest: symmetric with normal excursions and percussion.  Abdomen: Soft, with Nl bowel sounds. Nontender, no guarding, rebound, hernias, masses, or organomegaly.  Lymphatics: Non tender without lymphadenopathy.  Genitourinary: No hernias.Testes nl. DRE - prostate nl for age - smooth & firm w/o nodules. Musculoskeletal: Full ROM all peripheral extremities, joint stability, 5/5 strength, and normal gait. Skin: Warm and dry without rashes, lesions, cyanosis, clubbing or  ecchymosis.  Neuro: Cranial  nerves intact, reflexes equal bilaterally. Normal muscle tone, no cerebellar symptoms. Sensation intact.  Pysch: Alert and oriented X 3 with normal affect, insight and judgment appropriate.   Assessment and Plan  1. Essential hypertension  - EKG 12-Lead - [place], RETROPERITNL ABD,  LTD - Urinalysis, Routine w reflex microscopic - Microalbumin / creatinine urine ratio - CBC with Differential/Platelet - BASIC METABOLIC PANEL WITH GFR - Magnesium - TSH  2. Hyperlipidemia, mixed  - EKG 12-Lead - [place], RETROPERITNL ABD,  LTD - Hepatic function panel - Lipid panel - TSH  3. Prediabetes  - EKG 12-Lead - [place], RETROPERITNL ABD,  LTD - Hemoglobin A1c - Insulin, fasting  4. Vitamin D deficiency  - VITAMIN D 25 Hydroxy  5. Abnormal glucose  - Hemoglobin A1c - Insulin, fasting  6. ASHD (arteriosclerotic  heart disease)  - EKG 12-Lead  7. Gastroesophageal reflux disease,  - CBC with Differential/Platelet  8. Cerebral infarction due to occlusion of other cerebral artery (HCC)  - Lipid panel  9. Vascular dementia without behavioral disturbance   10. Screening for colorectal cancer  - POC Hemoccult Bld/Stl  11. Prostate cancer screening  - PSA  12. Bladder neck obstruction  - PSA  13. Screening for ischemic heart disease  - EKG 12-Lead - Lipid panel  14. Screening for AAA (aortic abdominal aneurysm)  - [place], RETROPERITNL ABD,  LTD  15. Medication management  - Urinalysis, Routine w reflex microscopic - Microalbumin / creatinine urine ratio - CBC with Differential/Platelet - BASIC METABOLIC PANEL WITH GFR - Hepatic function panel - Magnesium - Lipid panel - TSH - Hemoglobin A1c - Insulin, fasting - VITAMIN D 25 Hydroxy         Patient was counseled in prudent diet, weight control to achieve/maintain BMI less than 25, BP monitoring, regular exercise and medications as discussed.  Discussed med effects and SE's. Routine screening labs and tests as requested with regular follow-up as recommended. Over 40 minutes of exam, counseling, chart review and high complex critical decision making was performed

## 2017-04-04 ENCOUNTER — Encounter: Payer: Self-pay | Admitting: Internal Medicine

## 2017-04-04 ENCOUNTER — Ambulatory Visit (INDEPENDENT_AMBULATORY_CARE_PROVIDER_SITE_OTHER): Payer: Medicare Other | Admitting: Internal Medicine

## 2017-04-04 VITALS — BP 138/86 | HR 72 | Temp 97.3°F | Ht 71.25 in | Wt 181.0 lb

## 2017-04-04 DIAGNOSIS — R7303 Prediabetes: Secondary | ICD-10-CM

## 2017-04-04 DIAGNOSIS — K219 Gastro-esophageal reflux disease without esophagitis: Secondary | ICD-10-CM

## 2017-04-04 DIAGNOSIS — I6359 Cerebral infarction due to unspecified occlusion or stenosis of other cerebral artery: Secondary | ICD-10-CM | POA: Diagnosis not present

## 2017-04-04 DIAGNOSIS — Z1212 Encounter for screening for malignant neoplasm of rectum: Secondary | ICD-10-CM

## 2017-04-04 DIAGNOSIS — Z1211 Encounter for screening for malignant neoplasm of colon: Secondary | ICD-10-CM

## 2017-04-04 DIAGNOSIS — E559 Vitamin D deficiency, unspecified: Secondary | ICD-10-CM

## 2017-04-04 DIAGNOSIS — E782 Mixed hyperlipidemia: Secondary | ICD-10-CM

## 2017-04-04 DIAGNOSIS — Z136 Encounter for screening for cardiovascular disorders: Secondary | ICD-10-CM

## 2017-04-04 DIAGNOSIS — R7309 Other abnormal glucose: Secondary | ICD-10-CM | POA: Diagnosis not present

## 2017-04-04 DIAGNOSIS — Z79899 Other long term (current) drug therapy: Secondary | ICD-10-CM

## 2017-04-04 DIAGNOSIS — N320 Bladder-neck obstruction: Secondary | ICD-10-CM | POA: Diagnosis not present

## 2017-04-04 DIAGNOSIS — Z125 Encounter for screening for malignant neoplasm of prostate: Secondary | ICD-10-CM

## 2017-04-04 DIAGNOSIS — I10 Essential (primary) hypertension: Secondary | ICD-10-CM | POA: Diagnosis not present

## 2017-04-04 DIAGNOSIS — I2510 Atherosclerotic heart disease of native coronary artery without angina pectoris: Secondary | ICD-10-CM

## 2017-04-04 DIAGNOSIS — F0150 Vascular dementia without behavioral disturbance: Secondary | ICD-10-CM

## 2017-04-06 LAB — URINALYSIS, ROUTINE W REFLEX MICROSCOPIC
BILIRUBIN URINE: NEGATIVE
Bacteria, UA: NONE SEEN /HPF
GLUCOSE, UA: NEGATIVE
Hgb urine dipstick: NEGATIVE
Hyaline Cast: NONE SEEN /LPF
KETONES UR: NEGATIVE
NITRITE: NEGATIVE
PH: 6 (ref 5.0–8.0)
Protein, ur: NEGATIVE
SPECIFIC GRAVITY, URINE: 1.015 (ref 1.001–1.03)
SQUAMOUS EPITHELIAL / LPF: NONE SEEN /HPF (ref <=5)

## 2017-04-06 LAB — LIPID PANEL
CHOLESTEROL: 151 mg/dL (ref <200)
HDL: 59 mg/dL (ref >40)
LDL CHOLESTEROL (CALC): 76 mg/dL
Non-HDL Cholesterol (Calc): 92 mg/dL (calc) (ref <130)
Total CHOL/HDL Ratio: 2.6 (calc) (ref <5.0)
Triglycerides: 81 mg/dL (ref <150)

## 2017-04-06 LAB — BASIC METABOLIC PANEL WITH GFR
BUN / CREAT RATIO: 10 (calc) (ref 6–22)
BUN: 12 mg/dL (ref 7–25)
CALCIUM: 9.3 mg/dL (ref 8.6–10.3)
CHLORIDE: 107 mmol/L (ref 98–110)
CO2: 28 mmol/L (ref 20–32)
Creat: 1.18 mg/dL — ABNORMAL HIGH (ref 0.70–1.11)
GFR, Est African American: 61 mL/min/{1.73_m2} (ref >=60)
GFR, Est Non African American: 53 mL/min/{1.73_m2} — ABNORMAL LOW (ref >=60)
GLUCOSE: 83 mg/dL (ref 65–99)
Potassium: 4 mmol/L (ref 3.5–5.3)
Sodium: 142 mmol/L (ref 135–146)

## 2017-04-06 LAB — CBC WITH DIFFERENTIAL/PLATELET
BASOS PCT: 0.4 %
Basophils Absolute: 19 cells/uL (ref 0–200)
EOS ABS: 110 {cells}/uL (ref 15–500)
Eosinophils Relative: 2.3 %
HCT: 39.8 % (ref 38.5–50.0)
HEMOGLOBIN: 13.6 g/dL (ref 13.2–17.1)
Lymphs Abs: 1190 cells/uL (ref 850–3900)
MCH: 32.4 pg (ref 27.0–33.0)
MCHC: 34.2 g/dL (ref 32.0–36.0)
MCV: 94.8 fL (ref 80.0–100.0)
MPV: 10.6 fL (ref 7.5–12.5)
Monocytes Relative: 12.4 %
NEUTROS ABS: 2885 {cells}/uL (ref 1500–7800)
Neutrophils Relative %: 60.1 %
PLATELETS: 209 10*3/uL (ref 140–400)
RBC: 4.2 10*6/uL (ref 4.20–5.80)
RDW: 11.8 % (ref 11.0–15.0)
TOTAL LYMPHOCYTE: 24.8 %
WBC: 4.8 10*3/uL (ref 3.8–10.8)
WBCMIX: 595 {cells}/uL (ref 200–950)

## 2017-04-06 LAB — MICROALBUMIN / CREATININE URINE RATIO
Creatinine, Urine: 185 mg/dL (ref 20–370)
Microalb Creat Ratio: 5 mcg/mg creat (ref <30)
Microalb, Ur: 0.9 mg/dL

## 2017-04-06 LAB — INSULIN, FASTING: Insulin: 9.9 u[IU]/mL (ref 2.0–19.6)

## 2017-04-06 LAB — HEMOGLOBIN A1C
HEMOGLOBIN A1C: 4.7 %{Hb} (ref <5.7)
MEAN PLASMA GLUCOSE: 88 (calc)
eAG (mmol/L): 4.9 (calc)

## 2017-04-06 LAB — TSH: TSH: 1.46 m[IU]/L (ref 0.40–4.50)

## 2017-04-06 LAB — HEPATIC FUNCTION PANEL
AG RATIO: 1.4 (calc) (ref 1.0–2.5)
ALBUMIN MSPROF: 3.7 g/dL (ref 3.6–5.1)
ALT: 9 U/L (ref 9–46)
AST: 16 U/L (ref 10–35)
Alkaline phosphatase (APISO): 70 U/L (ref 40–115)
BILIRUBIN INDIRECT: 0.6 mg/dL (ref 0.2–1.2)
Bilirubin, Direct: 0.2 mg/dL (ref 0.0–0.2)
GLOBULIN: 2.6 g/dL (ref 1.9–3.7)
TOTAL PROTEIN: 6.3 g/dL (ref 6.1–8.1)
Total Bilirubin: 0.8 mg/dL (ref 0.2–1.2)

## 2017-04-06 LAB — VITAMIN D 25 HYDROXY (VIT D DEFICIENCY, FRACTURES): VIT D 25 HYDROXY: 47 ng/mL (ref 30–100)

## 2017-04-06 LAB — MAGNESIUM: MAGNESIUM: 2.1 mg/dL (ref 1.5–2.5)

## 2017-04-06 LAB — PSA: PSA: 1.1 ng/mL (ref <=4.0)

## 2017-05-24 DIAGNOSIS — Z85828 Personal history of other malignant neoplasm of skin: Secondary | ICD-10-CM | POA: Diagnosis not present

## 2017-05-24 DIAGNOSIS — D1801 Hemangioma of skin and subcutaneous tissue: Secondary | ICD-10-CM | POA: Diagnosis not present

## 2017-05-24 DIAGNOSIS — Z85820 Personal history of malignant melanoma of skin: Secondary | ICD-10-CM | POA: Diagnosis not present

## 2017-05-24 DIAGNOSIS — D485 Neoplasm of uncertain behavior of skin: Secondary | ICD-10-CM | POA: Diagnosis not present

## 2017-06-05 ENCOUNTER — Other Ambulatory Visit: Payer: Self-pay | Admitting: Physician Assistant

## 2017-06-05 ENCOUNTER — Other Ambulatory Visit: Payer: Self-pay | Admitting: Internal Medicine

## 2017-07-10 ENCOUNTER — Ambulatory Visit (INDEPENDENT_AMBULATORY_CARE_PROVIDER_SITE_OTHER): Payer: Medicare Other

## 2017-07-10 DIAGNOSIS — Z23 Encounter for immunization: Secondary | ICD-10-CM

## 2017-07-13 ENCOUNTER — Ambulatory Visit: Payer: Self-pay | Admitting: Adult Health

## 2017-09-11 ENCOUNTER — Other Ambulatory Visit: Payer: Self-pay | Admitting: Internal Medicine

## 2017-10-11 ENCOUNTER — Ambulatory Visit: Payer: Self-pay | Admitting: Internal Medicine

## 2017-10-31 ENCOUNTER — Ambulatory Visit (INDEPENDENT_AMBULATORY_CARE_PROVIDER_SITE_OTHER): Payer: Medicare Other | Admitting: Internal Medicine

## 2017-10-31 ENCOUNTER — Encounter: Payer: Self-pay | Admitting: Internal Medicine

## 2017-10-31 ENCOUNTER — Other Ambulatory Visit: Payer: Self-pay | Admitting: *Deleted

## 2017-10-31 VITALS — BP 144/80 | HR 68 | Temp 97.5°F | Resp 16 | Ht 71.25 in | Wt 175.0 lb

## 2017-10-31 DIAGNOSIS — R7309 Other abnormal glucose: Secondary | ICD-10-CM

## 2017-10-31 DIAGNOSIS — I10 Essential (primary) hypertension: Secondary | ICD-10-CM | POA: Diagnosis not present

## 2017-10-31 DIAGNOSIS — E782 Mixed hyperlipidemia: Secondary | ICD-10-CM

## 2017-10-31 DIAGNOSIS — I2510 Atherosclerotic heart disease of native coronary artery without angina pectoris: Secondary | ICD-10-CM | POA: Diagnosis not present

## 2017-10-31 DIAGNOSIS — R634 Abnormal weight loss: Secondary | ICD-10-CM | POA: Diagnosis not present

## 2017-10-31 DIAGNOSIS — E559 Vitamin D deficiency, unspecified: Secondary | ICD-10-CM | POA: Diagnosis not present

## 2017-10-31 DIAGNOSIS — K219 Gastro-esophageal reflux disease without esophagitis: Secondary | ICD-10-CM

## 2017-10-31 DIAGNOSIS — R7303 Prediabetes: Secondary | ICD-10-CM | POA: Diagnosis not present

## 2017-10-31 DIAGNOSIS — Z79899 Other long term (current) drug therapy: Secondary | ICD-10-CM | POA: Diagnosis not present

## 2017-10-31 MED ORDER — MIRTAZAPINE 30 MG PO TABS: ORAL_TABLET | ORAL | 1 refills | Status: DC

## 2017-10-31 MED ORDER — FINASTERIDE 5 MG PO TABS: 5.0000 mg | ORAL_TABLET | Freq: Every day | ORAL | 3 refills | Status: DC

## 2017-10-31 NOTE — Progress Notes (Signed)
This very nice 84 y.o. DWM presents for 6 month follow up with HTN, HLD, Pre-Diabetes and Vitamin D Deficiency.  Patient's GERD is quiescent on Ranitidine. Patient is brought by his daughter Christella Noa who recounts his worsening short term memory and that she & her brother - Rema Fendt are closely supervising him.      Patient is treated for HTN (1998)  & BP has been controlled at home. Today's  . Patient has a remote hx/o pSVT. Patient has had no complaints of any cardiac type chest pain, palpitations, dyspnea / orthopnea / PND, dizziness, claudication, or dependent edema.     Hyperlipidemia is controlled with diet. Last Lipids were at goal: Lab Results  Component Value Date   CHOL 151 04/04/2017   HDL 59 04/04/2017   LDLCALC 76 04/04/2017   TRIG 81 04/04/2017   CHOLHDL 2.6 04/04/2017      Also, the patient has history of PreDiabetes (A1c 5.7%/2011) and has had no symptoms of reactive hypoglycemia, diabetic polys, paresthesias or visual blurring.  Last A1c was Normal & at goal: Lab Results  Component Value Date   HGBA1C 4.7 04/04/2017      Further, the patient also has history of Vitamin D Deficiency ("30"/2008) and supplements vitamin D without any suspected side-effects. Last vitamin D was still low: Lab Results  Component Value Date   VD25OH 47 04/04/2017   Current Outpatient Medications on File Prior to Visit  Medication Sig  . bisoprolol (ZEBETA) 5 MG tablet TAKE 1 TABLET ONCE DAILY.  . finasteride (PROSCAR) 5 MG tablet Take 5 mg by mouth daily. In AM  . losartan (COZAAR) 100 MG tablet TAKE 1/2 TO 1 TABLET DAILY FOR BLOOD PRESSURE.  . montelukast (SINGULAIR) 10 MG tablet TAKE 1 TABLET ONCE A DAY FOR ALLERGIES.  Marland Kitchen ranitidine (ZANTAC) 300 MG tablet TAKE ONE TABLET AT BEDTIME.  Marland Kitchen sertraline (ZOLOFT) 50 MG tablet Take 1 tablet (50 mg total) by mouth daily.  . verapamil (CALAN-SR) 240 MG CR tablet TAKE 1 TABLET TWICE DAILY AFTER MEALS FOR HEART RHYTHM.   No current  facility-administered medications on file prior to visit.    Allergies  Allergen Reactions  . Demerol [Meperidine]   . Penicillins    PMHx:   Past Medical History:  Diagnosis Date  . ASHD (arteriosclerotic heart disease)   . Hypertension   . Prediabetes   . Vitamin D deficiency    Immunization History  Administered Date(s) Administered  . Influenza Split 06/03/2013  . Influenza, High Dose Seasonal PF 05/20/2014, 05/05/2015, 06/06/2016, 07/10/2017  . Pneumococcal Conjugate-13 03/02/2016  . Pneumococcal Polysaccharide-23 10/27/2014  . Td 08/09/2003  . Zoster 08/25/2010   Past Surgical History:  Procedure Laterality Date  . CATARACT EXTRACTION W/ INTRAOCULAR LENS IMPLANT Right 1993  . CATARACT EXTRACTION W/ INTRAOCULAR LENS IMPLANT Left 2001  . CHOLECYSTECTOMY  2001  . COLECTOMY  1992   FHx:    Reviewed / unchanged  SHx:    Reviewed / unchanged  Systems Review:  Constitutional: Denies fever, chills, wt changes, headaches, insomnia, fatigue, night sweats, change in appetite. Eyes: Denies redness, blurred vision, diplopia, discharge, itchy, watery eyes.  ENT: Denies discharge, congestion, post nasal drip, epistaxis, sore throat, earache, hearing loss, dental pain, tinnitus, vertigo, sinus pain, snoring.  CV: Denies chest pain, palpitations, irregular heartbeat, syncope, dyspnea, diaphoresis, orthopnea, PND, claudication or edema. Respiratory: denies cough, dyspnea, DOE, pleurisy, hoarseness, laryngitis, wheezing.  Gastrointestinal: Denies dysphagia, odynophagia, heartburn, reflux, water brash,  abdominal pain or cramps, nausea, vomiting, bloating, diarrhea, constipation, hematemesis, melena, hematochezia  or hemorrhoids. Genitourinary: Denies dysuria, frequency, urgency, nocturia, hesitancy, discharge, hematuria or flank pain. Musculoskeletal: Denies arthralgias, myalgias, stiffness, jt. swelling, pain, limping or strain/sprain.  Skin: Denies pruritus, rash, hives, warts, acne,  eczema or change in skin lesion(s). Neuro: No weakness, tremor, incoordination, spasms, paresthesia or pain. Psychiatric: Denies confusion, memory loss or sensory loss. Endo: Denies change in weight, skin or hair change.  Heme/Lymph: No excessive bleeding, bruising or enlarged lymph nodes.  Physical Exam  There were no vitals taken for this visit.  Appears  well nourished, well groomed  and in no distress.  Eyes: PERRLA, EOMs, conjunctiva no swelling or erythema. Sinuses: No frontal/maxillary tenderness ENT/Mouth: EAC's clear, TM's nl w/o erythema, bulging. Nares clear w/o erythema, swelling, exudates. Oropharynx clear without erythema or exudates. Oral hygiene is good. Tongue normal, non obstructing. Hearing intact.  Neck: Supple. Thyroid not palpable. Car 2+/2+ without bruits, nodes or JVD. Chest: Respirations nl with BS clear & equal w/o rales, rhonchi, wheezing or stridor.  Cor: Heart sounds normal w/ regular rate and rhythm without sig. murmurs, gallops, clicks or rubs. Peripheral pulses normal and equal  without edema.  Abdomen: Soft & bowel sounds normal. Non-tender w/o guarding, rebound, hernias, masses or organomegaly.  Lymphatics: Unremarkable.  Musculoskeletal: Full ROM all peripheral extremities, joint stability, 5/5 strength and normal gait.  Skin: Warm, dry without exposed rashes, lesions or ecchymosis apparent.  Neuro: Cranial nerves intact, reflexes equal bilaterally. Sensory-motor testing grossly intact. Tendon reflexes grossly intact.  Pysch: Alert & oriented x 3.  Insight and judgement nl & appropriate. No ideations.  Assessment and Plan:  1. Essential hypertension  - Continue medication, monitor blood pressure at home.  - Continue DASH diet. Reminder to go to the ER if any CP,  SOB, nausea, dizziness, severe HA, changes vision/speech.  - CBC with Differential/Platelet - BASIC METABOLIC PANEL WITH GFR - Magnesium - TSH  2. Hyperlipidemia, mixed  - Continue  diet/meds, exercise,& lifestyle modifications.  - Continue monitor periodic cholesterol/liver & renal functions   - Hepatic function panel - Lipid panel - TSH  3. Abnormal glucose  - Continue diet, exercise,  - lifestyle modifications.  - Monitor appropriate labs.  - Hemoglobin A1c - Insulin, random  4. Vitamin D deficiency  - Continue supplementation.  - VITAMIN D 25 Hydroxy  - Cholecalciferol (VITAMIN D) 2000 units CAPS; Take 1 capsule by mouth daily.  5. Prediabetes  - Hemoglobin A1c - Insulin, random  6. ASHD (arteriosclerotic heart disease)  - Lipid panel - aspirin EC 81 MG tablet; Take 81 mg by mouth daily.  7. Gastroesophageal reflux disease  - CBC with Differential/Platelet  8. Medication management  - CBC with Differential/Platelet - BASIC METABOLIC PANEL WITH GFR - Hepatic function panel - Magnesium - Lipid panel - TSH - Hemoglobin A1c - Insulin, random - VITAMIN D 25 Hydroxy  9. Weight loss - mirtazapine (REMERON) 30 MG tablet; Take 1 tablet at bedtime for appetite  Dispense: 90 tablet; Refill: 1           Discussed  regular exercise, BP monitoring, weight control to achieve/maintain BMI less than 25 and discussed med and SE's. Recommended labs to assess and monitor clinical status with further disposition pending results of labs. Over 30 minutes of exam, counseling, chart review was performed.

## 2017-10-31 NOTE — Patient Instructions (Signed)

## 2017-11-01 LAB — BASIC METABOLIC PANEL WITH GFR
BUN / CREAT RATIO: 15 (calc) (ref 6–22)
BUN: 18 mg/dL (ref 7–25)
CALCIUM: 9.6 mg/dL (ref 8.6–10.3)
CHLORIDE: 106 mmol/L (ref 98–110)
CO2: 27 mmol/L (ref 20–32)
CREATININE: 1.2 mg/dL — AB (ref 0.70–1.11)
GFR, Est African American: 60 mL/min/{1.73_m2} (ref >=60)
GFR, Est Non African American: 52 mL/min/{1.73_m2} — ABNORMAL LOW (ref >=60)
GLUCOSE: 92 mg/dL (ref 65–99)
Potassium: 4.4 mmol/L (ref 3.5–5.3)
Sodium: 143 mmol/L (ref 135–146)

## 2017-11-01 LAB — CBC WITH DIFFERENTIAL/PLATELET
BASOS PCT: 0.5 %
Basophils Absolute: 33 cells/uL (ref 0–200)
EOS ABS: 228 {cells}/uL (ref 15–500)
Eosinophils Relative: 3.5 %
HEMATOCRIT: 41.1 % (ref 38.5–50.0)
Hemoglobin: 14.4 g/dL (ref 13.2–17.1)
LYMPHS ABS: 2373 {cells}/uL (ref 850–3900)
MCH: 32.5 pg (ref 27.0–33.0)
MCHC: 35 g/dL (ref 32.0–36.0)
MCV: 92.8 fL (ref 80.0–100.0)
MPV: 10.3 fL (ref 7.5–12.5)
Monocytes Relative: 10.4 %
Neutro Abs: 3192 cells/uL (ref 1500–7800)
Neutrophils Relative %: 49.1 %
PLATELETS: 229 10*3/uL (ref 140–400)
RBC: 4.43 10*6/uL (ref 4.20–5.80)
RDW: 11.5 % (ref 11.0–15.0)
TOTAL LYMPHOCYTE: 36.5 %
WBC: 6.5 10*3/uL (ref 3.8–10.8)
WBCMIX: 676 {cells}/uL (ref 200–950)

## 2017-11-01 LAB — INSULIN, RANDOM: INSULIN: 4.4 u[IU]/mL (ref 2.0–19.6)

## 2017-11-01 LAB — HEMOGLOBIN A1C
HEMOGLOBIN A1C: 4.9 %{Hb} (ref <5.7)
MEAN PLASMA GLUCOSE: 94 (calc)
eAG (mmol/L): 5.2 (calc)

## 2017-11-01 LAB — HEPATIC FUNCTION PANEL
AG RATIO: 1.6 (calc) (ref 1.0–2.5)
ALBUMIN MSPROF: 4 g/dL (ref 3.6–5.1)
ALT: 9 U/L (ref 9–46)
AST: 15 U/L (ref 10–35)
Alkaline phosphatase (APISO): 71 U/L (ref 40–115)
Bilirubin, Direct: 0.2 mg/dL (ref 0.0–0.2)
GLOBULIN: 2.5 g/dL (ref 1.9–3.7)
Indirect Bilirubin: 0.6 mg/dL (calc) (ref 0.2–1.2)
TOTAL PROTEIN: 6.5 g/dL (ref 6.1–8.1)
Total Bilirubin: 0.8 mg/dL (ref 0.2–1.2)

## 2017-11-01 LAB — TSH: TSH: 2.03 m[IU]/L (ref 0.40–4.50)

## 2017-11-01 LAB — LIPID PANEL
Cholesterol: 159 mg/dL (ref <200)
HDL: 53 mg/dL (ref >40)
LDL Cholesterol (Calc): 87 mg/dL (calc)
Non-HDL Cholesterol (Calc): 106 mg/dL (calc) (ref <130)
Total CHOL/HDL Ratio: 3 (calc) (ref <5.0)
Triglycerides: 97 mg/dL (ref <150)

## 2017-11-01 LAB — MAGNESIUM: Magnesium: 2.1 mg/dL (ref 1.5–2.5)

## 2017-11-01 LAB — VITAMIN D 25 HYDROXY (VIT D DEFICIENCY, FRACTURES): VIT D 25 HYDROXY: 47 ng/mL (ref 30–100)

## 2018-01-15 ENCOUNTER — Other Ambulatory Visit: Payer: Self-pay | Admitting: Internal Medicine

## 2018-01-26 ENCOUNTER — Ambulatory Visit (INDEPENDENT_AMBULATORY_CARE_PROVIDER_SITE_OTHER): Payer: Medicare Other | Admitting: Podiatry

## 2018-01-26 ENCOUNTER — Encounter: Payer: Self-pay | Admitting: Podiatry

## 2018-01-26 DIAGNOSIS — M79675 Pain in left toe(s): Secondary | ICD-10-CM

## 2018-01-26 DIAGNOSIS — M79674 Pain in right toe(s): Secondary | ICD-10-CM | POA: Diagnosis not present

## 2018-01-26 DIAGNOSIS — B351 Tinea unguium: Secondary | ICD-10-CM | POA: Diagnosis not present

## 2018-01-29 NOTE — Progress Notes (Signed)
Joseph Cherry is a 84 y.o. WM who presents to clinic escorted by his son. His son relates his Dad has severely elongated toenails 1-5 b/l which are tender when wearing enclosed shoe gear. Son relates his dad has not let anyone cut his toenails and he thought he should seek professional care.  PMH: Problem List Collapse by Default  Collapse by Default      Cardiovascular and Mediastinum  Hypertension   ASHD (arteriosclerotic heart disease)   Digestive  GERD    Nervous and Auditory  Cerebral infarction ([place])   Other  Prediabetes   Vitamin D deficiency   Hyperlipidemia   Medication management   BMI 26.0-26.9,adult   Encounter for general adult medical examination with abnormal findings    Medical History Collapse by Default  Collapse by Default      Date Unknown ASHD (arteriosclerotic heart disease)  Date Unknown Hypertension  Date Unknown Prediabetes  Date Unknown Vitamin D deficiency   Surgical History Collapse by Default  Collapse by Default      2001 Cholecystectomy  2001 Cataract extraction w/ intraocular lens implant (Left)  1993 Cataract extraction w/ intraocular lens implant (Right)  1992 Colectomy   Family History Collapse by Default  Collapse by Default      Mother AAA (abdominal aortic aneurysm)    Heart disease    Hypertension         Father Heart disease         Sister Heart disease         Brother Early death     Tobacco History Collapse by Default  Collapse by Default      Smoking Status  Former Smoker  Quit 06/23/2014  Types  Cigars     Smokeless Tobacco Status  Never Used          Objective: Vascular Examination: Capillary refill time <3 seconds x 10 digits Dorsalis pedis and posterior tibial pulses faintly palpable b/l No digital hair x 10 digits Skin temperature warm to warm b/l +Varicosities b/l LE  Dermatological Examination: Normal turgor, texture and tone b/l Toenails 1-5 b/l discolored, thick, dystrophic with  subungual debris and pain with palpation to nailbeds due to thickness of nails.  Musculoskeletal: Muscle strength 5/5 to all LE muscle groups  Neurological: Sensation intact with 10 gram monofilament. Vibratory sensation intact.  Assessment: Painful onychomycosis toenails 1-5 b/l   Plan: 1. Toenails 1-5 b/l were debrided in length and girth without iatrogenic bleeding. 2. Patient to continue soft, supportive shoe gear 3. Patient to report any pedal injuries to medical professional immediately. 4. Follow up 3 months.  5. Patient/POA to call should there be a concern in the interim.

## 2018-02-14 ENCOUNTER — Ambulatory Visit: Payer: Self-pay | Admitting: Adult Health

## 2018-02-26 DIAGNOSIS — F329 Major depressive disorder, single episode, unspecified: Secondary | ICD-10-CM

## 2018-02-26 DIAGNOSIS — Z9109 Other allergy status, other than to drugs and biological substances: Secondary | ICD-10-CM | POA: Insufficient documentation

## 2018-02-26 DIAGNOSIS — N400 Enlarged prostate without lower urinary tract symptoms: Secondary | ICD-10-CM | POA: Insufficient documentation

## 2018-02-26 DIAGNOSIS — F0151 Vascular dementia with behavioral disturbance: Secondary | ICD-10-CM | POA: Insufficient documentation

## 2018-02-26 DIAGNOSIS — N183 Chronic kidney disease, stage 3 unspecified: Secondary | ICD-10-CM | POA: Insufficient documentation

## 2018-02-26 DIAGNOSIS — F0153 Vascular dementia, unspecified severity, with mood disturbance: Secondary | ICD-10-CM | POA: Insufficient documentation

## 2018-02-26 NOTE — Progress Notes (Signed)
MEDICARE ANNUAL WELLNESS VISIT AND FOLLOW UP Assessment:   Encounter for annual medicare wellness visit  ASHD (arteriosclerotic heart disease) -cont BP control -not on statin secondary to age  Essential hypertension -cont meds -well controlled -dash diet -exercise as tolerated  Gastroesophageal reflux disease, esophagitis presence not specified -zantac prn -avoid trigger foods  History of hemorrhagic CVA -historical -cont BP control  BMI 24 -well controlled  Hyperlipidemia -cont diet and exercise - defer lipid panel as wouldn't aggressively treat  Medication management -monitor labs biyearly  Other abnormal glucose -cont diet and exercise  Vitamin D deficiency -cont Vit D supplement  Vascular dementia with depressed mood -patient does not want to take any medications for memory -continue to keep notes, follow routine - continue remeron for mood/appetite -if difficulty with medications we can get home health out there to help - mme 19 today  BPH Continue medications  Environmental allergies Avoid triggers, continue singulair  CKD III Increase fluids, avoid NSAIDS, monitor sugars, will monitor  Need for tetanus booster Wants to boost prior to river trip, Td administered  Over 30 minutes of exam, counseling, chart review, and critical decision making was performed  Future Appointments  Date Time Provider [place]  05/17/2018 10:00 AM Unk Pinto, MD [facility] None     Plan:   During the course of the visit the patient was educated and counseled about appropriate screening and preventive services including:    Pneumococcal vaccine   Influenza vaccine  Prevnar 13  Td vaccine  Screening electrocardiogram  Colorectal cancer screening  Diabetes screening  Glaucoma screening  Nutrition counseling    Subjective:  Joseph Cherry is a 84 y.o. male who presents for Medicare Annual Wellness Visit and 3 month follow up for HTN,  hyperlipidemia, glucose management, and vitamin D Def.   Pt has hx of hemorrhagic CVA; he has reported memory changes in recent years; He is accompanied by his daughter; family helps by keeping a calender, and he keeps notes for himself. Uses a pill box for meds. He is on remeron for mood and appetite. Still living at home by himself with frequent close supervision, will probably be moving in with son soon.   BMI is Body mass index is 25.76 kg/m., he has been working on diet and exercise. Wt Readings from Last 3 Encounters:  02/27/18 186 lb (84.4 kg)  10/31/17 175 lb (79.4 kg)  04/04/17 181 lb (82.1 kg)   His blood pressure has been controlled at home, today their BP is BP: 140/64 He does not workout. He denies chest pain, shortness of breath, dizziness.    He is not on cholesterol medication and denies myalgias. His cholesterol is at goal. The cholesterol last visit was:   Lab Results  Component Value Date   CHOL 159 10/31/2017   HDL 53 10/31/2017   LDLCALC 87 10/31/2017   TRIG 97 10/31/2017   CHOLHDL 3.0 10/31/2017   : Blood sugars have been well controlled with diet and exercise.  He does walk daily.  Lab Results  Component Value Date   HGBA1C 4.9 10/31/2017   Last GFR Lab Results  Component Value Date   GFRNONAA 52 (L) 10/31/2017   Patient is on Vitamin D supplement.   Lab Results  Component Value Date   VD25OH 47 10/31/2017         Medication Review: Current Outpatient Medications on File Prior to Visit  Medication Sig Dispense Refill  . aspirin EC 81 MG tablet Take 81  mg by mouth daily.    . bisoprolol (ZEBETA) 5 MG tablet TAKE 1 TABLET ONCE DAILY. 90 tablet 1  . Cholecalciferol (VITAMIN D) 2000 units CAPS Take 1 capsule by mouth daily.    . finasteride (PROSCAR) 5 MG tablet Take 1 tablet (5 mg total) by mouth daily. In AM 90 tablet 3  . losartan (COZAAR) 100 MG tablet TAKE 1/2 TO 1 TABLET DAILY FOR BLOOD PRESSURE. 90 tablet 0  . mirtazapine (REMERON) 30 MG  tablet Take 1 tablet at bedtime for appetite 90 tablet 1  . montelukast (SINGULAIR) 10 MG tablet TAKE 1 TABLET ONCE A DAY FOR ALLERGIES. 30 tablet PRN  . Multiple Vitamin (MULTIVITAMIN) tablet Take 1 tablet by mouth daily.    . ranitidine (ZANTAC) 300 MG tablet TAKE ONE TABLET AT BEDTIME. 90 tablet 0  . verapamil (CALAN-SR) 240 MG CR tablet TAKE 1 TABLET TWICE DAILY AFTER MEALS FOR HEART RHYTHM. 180 tablet 1   No current facility-administered medications on file prior to visit.     Allergies: Allergies  Allergen Reactions  . Demerol [Meperidine]   . Penicillins     Current Problems (verified) has Hypertension; Other abnormal glucose; ASHD (arteriosclerotic heart disease); Vitamin D deficiency; Hyperlipidemia; Medication management; History of cerebral infarction; GERD ; BMI 24.0-24.9, adult; Environmental allergies; BPH (benign prostatic hyperplasia); Vascular dementia with depressed mood; and CKD (chronic kidney disease) stage 3, GFR 30-59 ml/min (HCC) on their problem list.  Screening Tests Immunization History  Administered Date(s) Administered  . Influenza Split 06/03/2013  . Influenza, High Dose Seasonal PF 05/20/2014, 05/05/2015, 06/06/2016, 07/10/2017  . Pneumococcal Conjugate-13 03/02/2016  . Pneumococcal Polysaccharide-23 10/27/2014  . Td 08/09/2003, 02/27/2018  . Zoster 08/25/2010    Preventative care: Last colonoscopy: remote, aged out  Prior vaccinations: TD or Tdap: 2019  Influenza: 2018  Pneumococcal: 2016 Prevnar13: 2017 Shingles/Zostavax: 2012  Names of Other Physician/Practitioners you currently use: 1. [facility] here for primary care 2. Herbert Deaner, eye doctor, last visit 2019 3. Ronnald Ramp, dentist, 2019  Patient Care Team: Unk Pinto, MD as PCP - General (Internal Medicine) Carolan Clines, MD as Consulting Physician (Urology)  Surgical: He  has a past surgical history that includes Colectomy (1992);  Cataract extraction w/ intraocular lens implant (Right, 1993); Cholecystectomy (2001); and Cataract extraction w/ intraocular lens implant (Left, 2001). Family His family history includes AAA (abdominal aortic aneurysm) in his mother; Early death in his brother; Heart disease in his father, mother, and sister; Hypertension in his mother. Social history  He reports that he quit smoking about 3 years ago. His smoking use included cigars. He has never used smokeless tobacco. He reports that he drinks about 4.2 oz of alcohol per week. He reports that he does not use drugs.  MEDICARE WELLNESS OBJECTIVES: Physical activity: Current Exercise Habits: The patient does not participate in regular exercise at present, Exercise limited by: neurologic condition(s) Cardiac risk factors: Cardiac Risk Factors include: advanced age (>59men, >17 women);hypertension;male gender;sedentary lifestyle;dyslipidemia;smoking/ tobacco exposure Depression/mood screen:   Depression screen [facility] 2/9 02/27/2018  Decreased Interest 0  Down, Depressed, Hopeless 0  PHQ - 2 Score 0  Altered sleeping -  Tired, decreased energy -  Change in appetite -  Feeling bad or failure about yourself  -  Trouble concentrating -  Moving slowly or fidgety/restless -  Suicidal thoughts -  PHQ-9 Score -  Difficult doing work/chores -    ADLs:  In your present state of health, do you have any  difficulty performing the following activities: 02/27/2018 10/31/2017  Hearing? N N  Vision? N N  Difficulty concentrating or making decisions? Y N  Comment short term memory loss, declines neuro referral or medications, stable in past year -  Walking or climbing stairs? N N  Dressing or bathing? N N  Doing errands, shopping? Y N  Comment Driven by family  -  Conservation officer, nature and eating ? Y -  Comment can do simple such as sandwhich  -  Using the Toilet? N -  In the past six months, have you accidently leaked urine? N -  Do you have problems with  loss of bowel control? N -  Managing your Medications? Y -  Comment Needs reminders -  Managing your Finances? Y -  Comment Managed by son -  Housekeeping or managing your Housekeeping? Y -  Comment Family helps -  Some recent data might be hidden     Cognitive Testing   MMSE - Mini Mental State Exam 02/27/2018  Orientation to time 0  Orientation to Place 5  Registration 3  Attention/ Calculation 2  Recall 0  Language- name 2 objects 2  Language- repeat 1  Language- follow 3 step command 3  Language- read & follow direction 1  Write a sentence 1  Copy design 1  Total score 19    EOL planning: Does Patient Have a Medical Advance Directive?: Yes Type of Advance Directive: [place] will Does patient want to make changes to medical advance directive?: No - Patient declined Copy of [place] in Chart?: No - copy requested   Objective:   Today's Vitals   02/27/18 1548  BP: 140/64  Pulse: 64  Temp: (!) 97.3 F (36.3 C)  SpO2: 99%  Weight: 186 lb (84.4 kg)  Height: 5' 11.25" (1.81 m)   Body mass index is 25.76 kg/m.  General appearance: alert, no distress, WD/WN, male HEENT: normocephalic, sclerae anicteric, TMs pearly, nares patent, no discharge or erythema, pharynx normal Oral cavity: MMM, no lesions Neck: supple, no lymphadenopathy, no thyromegaly, no masses Heart: RRR, normal S1, S2, no murmurs Lungs: CTA bilaterally, no wheezes, rhonchi, or rales Abdomen: +bs, soft, non tender, non distended, no masses, no hepatomegaly, no splenomegaly Musculoskeletal: nontender, no swelling, no obvious deformity Extremities: no edema, no cyanosis, no clubbing Pulses: 2+ symmetric, upper and lower extremities, normal cap refill Neurological: alert, oriented x 2, CN2-12 intact, strength normal upper extremities and lower extremities, sensation normal throughout, DTRs 2+ throughout, no cerebellar signs, gait normal, poor short term  recall Psychiatric: normal affect, behavior normal, pleasant   Medicare Attestation I have personally reviewed: The patient's medical and social history Their use of alcohol, tobacco or illicit drugs Their current medications and supplements The patient's functional ability including ADLs,fall risks, home safety risks, cognitive, and hearing and visual impairment Diet and physical activities Evidence for depression or mood disorders  The patient's weight, height, BMI, and visual acuity have been recorded in the chart.  I have made referrals, counseling, and provided education to the patient based on review of the above and I have provided the patient with a written personalized care plan for preventive services.     Izora Ribas, NP   02/27/2018

## 2018-02-27 ENCOUNTER — Ambulatory Visit (INDEPENDENT_AMBULATORY_CARE_PROVIDER_SITE_OTHER): Payer: Medicare Other | Admitting: Adult Health

## 2018-02-27 ENCOUNTER — Encounter: Payer: Self-pay | Admitting: Adult Health

## 2018-02-27 VITALS — BP 140/64 | HR 64 | Temp 97.3°F | Ht 71.25 in | Wt 186.0 lb

## 2018-02-27 DIAGNOSIS — Z0000 Encounter for general adult medical examination without abnormal findings: Secondary | ICD-10-CM

## 2018-02-27 DIAGNOSIS — N400 Enlarged prostate without lower urinary tract symptoms: Secondary | ICD-10-CM | POA: Diagnosis not present

## 2018-02-27 DIAGNOSIS — Z79899 Other long term (current) drug therapy: Secondary | ICD-10-CM

## 2018-02-27 DIAGNOSIS — E559 Vitamin D deficiency, unspecified: Secondary | ICD-10-CM | POA: Diagnosis not present

## 2018-02-27 DIAGNOSIS — K219 Gastro-esophageal reflux disease without esophagitis: Secondary | ICD-10-CM | POA: Diagnosis not present

## 2018-02-27 DIAGNOSIS — E782 Mixed hyperlipidemia: Secondary | ICD-10-CM | POA: Diagnosis not present

## 2018-02-27 DIAGNOSIS — Z9109 Other allergy status, other than to drugs and biological substances: Secondary | ICD-10-CM

## 2018-02-27 DIAGNOSIS — Z6824 Body mass index (BMI) 24.0-24.9, adult: Secondary | ICD-10-CM

## 2018-02-27 DIAGNOSIS — R7309 Other abnormal glucose: Secondary | ICD-10-CM

## 2018-02-27 DIAGNOSIS — Z23 Encounter for immunization: Secondary | ICD-10-CM

## 2018-02-27 DIAGNOSIS — D649 Anemia, unspecified: Secondary | ICD-10-CM | POA: Diagnosis not present

## 2018-02-27 DIAGNOSIS — R6889 Other general symptoms and signs: Secondary | ICD-10-CM

## 2018-02-27 DIAGNOSIS — I2510 Atherosclerotic heart disease of native coronary artery without angina pectoris: Secondary | ICD-10-CM | POA: Diagnosis not present

## 2018-02-27 DIAGNOSIS — F0151 Vascular dementia with behavioral disturbance: Secondary | ICD-10-CM | POA: Diagnosis not present

## 2018-02-27 DIAGNOSIS — Z8673 Personal history of transient ischemic attack (TIA), and cerebral infarction without residual deficits: Secondary | ICD-10-CM | POA: Diagnosis not present

## 2018-02-27 DIAGNOSIS — Z0001 Encounter for general adult medical examination with abnormal findings: Secondary | ICD-10-CM

## 2018-02-27 DIAGNOSIS — N183 Chronic kidney disease, stage 3 unspecified: Secondary | ICD-10-CM

## 2018-02-27 DIAGNOSIS — F329 Major depressive disorder, single episode, unspecified: Secondary | ICD-10-CM

## 2018-02-27 DIAGNOSIS — I10 Essential (primary) hypertension: Secondary | ICD-10-CM

## 2018-02-27 DIAGNOSIS — F0153 Vascular dementia, unspecified severity, with mood disturbance: Secondary | ICD-10-CM

## 2018-02-27 NOTE — Patient Instructions (Signed)
Aim for 7+ servings of fruits and vegetables daily  80+ fluid ounces of water or unsweet tea for healthy kidneys  Limit alcohol intake  Limit animal fats in diet for cholesterol and heart health - choose grass fed whenever available  Aim for low stress - take time to unwind and care for your mental health  Aim for 150 min of moderate intensity exercise weekly for heart health, and weights twice weekly for bone health  Aim for 7-9 hours of sleep daily      When it comes to diets, agreement about the perfect plan isn't easy to find, even among the experts. Experts at the [employer] developed an idea known as the Healthy Eating Plate. Just imagine a plate divided into logical, healthy portions.  The emphasis is on diet quality:  Load up on vegetables and fruits - one-half of your plate: Aim for color and variety, and remember that potatoes don't count.  Go for whole grains - one-quarter of your plate: Whole wheat, barley, wheat berries, quinoa, oats, brown rice, and foods made with them. If you want pasta, go with whole wheat pasta.  Protein power - one-quarter of your plate: Fish, chicken, beans, and nuts are all healthy, versatile protein sources. Limit red meat.  The diet, however, does go beyond the plate, offering a few other suggestions.  Use healthy plant oils, such as olive, canola, soy, corn, sunflower and peanut. Check the labels, and avoid partially hydrogenated oil, which have unhealthy trans fats.  If you're thirsty, drink water. Coffee and tea are good in moderation, but skip sugary drinks and limit milk and dairy products to one or two daily servings.  The type of carbohydrate in the diet is more important than the amount. Some sources of carbohydrates, such as vegetables, fruits, whole grains, and beans-are healthier than others.  Finally, stay active.      Dementia Caregiver Guide Dementia is a term used to describe a number of symptoms  that affect memory and thinking. The most common symptoms include:  Memory loss.  Trouble with language and communication.  Trouble concentrating.  Poor judgment.  Problems with reasoning.  Child-like behavior and language.  Extreme anxiety.  Angry outbursts.  Wandering from home or public places.  Dementia usually gets worse slowly over time. In the early stages, people with dementia can stay independent and safe with some help. In later stages, they need help with daily tasks such as dressing, grooming, and using the bathroom. How to help the person with dementia cope Dementia can be frightening and confusing. Here are some tips to help the person with dementia cope with changes caused by the disease. General tips  Keep the person on track with his or her routine.  Try to identify areas where the person may need help.  Be supportive, patient, calm, and encouraging.  Gently remind the person that adjusting to changes takes time.  Help with the tasks that the person has asked for help with.  Keep the person involved in daily tasks and decisions as much as possible.  Encourage conversation, but try not to get frustrated or harried if the person struggles to find words or does not seem to appreciate your help. Communication tips  When the person is talking or seems frustrated, make eye contact and hold the person's hand.  Ask specific questions that need yes or no answers.  Use simple words, short sentences, and a calm voice. Only give one direction at  a time.  When offering choices, limit them to just 1 or 2.  Avoid correcting the person in a negative way.  If the person is struggling to find the right words, gently try to help him or her. How to recognize symptoms of stress Symptoms of stress in caregivers include:  Feeling frustrated or angry with the person with dementia.  Denying that the person has dementia or that his or her symptoms will not  improve.  Feeling hopeless and unappreciated.  Difficulty sleeping.  Difficulty concentrating.  Feeling anxious, irritable, or depressed.  Developing stress-related health problems.  Feeling like you have too little time for your own life.  Follow these instructions at home:  Make sure that you and the person you are caring for: ? Get regular sleep. ? Exercise regularly. ? Eat regular, nutritious meals. ? Drink enough fluid to keep your urine clear or pale yellow. ? Take over-the-counter and prescription medicines only as told by your health care providers. ? Attend all scheduled health care appointments.  Join a support group with others who are caregivers.  Ask about respite care resources so that you can have a regular break from the stress of caregiving.  Look for signs of stress in yourself and in the person you are caring for. If you notice signs of stress, take steps to manage it.  Consider any safety risks and take steps to avoid them.  Organize medications in a pill box for each day of the week.  Create a plan to handle any legal or financial matters. Get legal or financial advice if needed.  Keep a calendar in a central location to remind the person of appointments or other activities. Tips for reducing the risk of injury  Keep floors clear of clutter. Remove rugs, magazine racks, and floor lamps.  Keep hallways well lit, especially at night.  Put a handrail and nonslip mat in the bathtub or shower.  Put childproof locks on cabinets that contain dangerous items, such as medicines, alcohol, guns, toxic cleaning items, sharp tools or utensils, matches, and lighters.  Put the locks in places where the person cannot see or reach them easily. This will help ensure that the person does not wander out of the house and get lost.  Be prepared for emergencies. Keep a list of emergency phone numbers and addresses in a convenient area.  Remove car keys and lock garage  doors so that the person does not try to get in the car and drive.  Have the person wear a bracelet that tracks locations and identifies the person as having memory problems. This should be worn at all times for safety. Where to find support: Many individuals and organizations offer support. These include:  Support groups for people with dementia and for caregivers.  Counselors or therapists.  Home health care services.  Adult day care centers.  Where to find more information: Alzheimer's Association: [employer] Contact a health care provider if:  The person's health is rapidly getting worse.  You are no longer able to care for the person.  Caring for the person is affecting your physical and emotional health.  The person threatens himself or herself, you, or anyone else. Summary  Dementia is a term used to describe a number of symptoms that affect memory and thinking.  Dementia usually gets worse slowly over time.  Take steps to reduce the person's risk of injury, and to plan for future care.  Caregivers need support, relief from caregiving, and time  for their own lives. This information is not intended to replace advice given to you by your health care provider. Make sure you discuss any questions you have with your health care provider. Document Released: 06/28/2016 Document Revised: 06/28/2016 Document Reviewed: 06/28/2016 Elsevier Interactive Patient Education  [employer].

## 2018-02-28 ENCOUNTER — Other Ambulatory Visit: Payer: Self-pay | Admitting: Adult Health

## 2018-02-28 DIAGNOSIS — D649 Anemia, unspecified: Secondary | ICD-10-CM

## 2018-02-28 LAB — CBC WITH DIFFERENTIAL/PLATELET
BASOS PCT: 0.4 %
Basophils Absolute: 28 cells/uL (ref 0–200)
EOS ABS: 199 {cells}/uL (ref 15–500)
Eosinophils Relative: 2.8 %
HEMATOCRIT: 38.8 % (ref 38.5–50.0)
Hemoglobin: 13 g/dL — ABNORMAL LOW (ref 13.2–17.1)
LYMPHS ABS: 2173 {cells}/uL (ref 850–3900)
MCH: 31.6 pg (ref 27.0–33.0)
MCHC: 33.5 g/dL (ref 32.0–36.0)
MCV: 94.4 fL (ref 80.0–100.0)
MPV: 10.5 fL (ref 7.5–12.5)
Monocytes Relative: 10.7 %
Neutro Abs: 3941 cells/uL (ref 1500–7800)
Neutrophils Relative %: 55.5 %
Platelets: 230 10*3/uL (ref 140–400)
RBC: 4.11 10*6/uL — AB (ref 4.20–5.80)
RDW: 11.7 % (ref 11.0–15.0)
Total Lymphocyte: 30.6 %
WBC: 7.1 10*3/uL (ref 3.8–10.8)
WBCMIX: 760 {cells}/uL (ref 200–950)

## 2018-02-28 LAB — COMPLETE METABOLIC PANEL WITH GFR
AG RATIO: 1.3 (calc) (ref 1.0–2.5)
ALT: 11 U/L (ref 9–46)
AST: 16 U/L (ref 10–35)
Albumin: 3.7 g/dL (ref 3.6–5.1)
Alkaline phosphatase (APISO): 67 U/L (ref 40–115)
BUN / CREAT RATIO: 17 (calc) (ref 6–22)
BUN: 20 mg/dL (ref 7–25)
CALCIUM: 9.6 mg/dL (ref 8.6–10.3)
CO2: 29 mmol/L (ref 20–32)
Chloride: 107 mmol/L (ref 98–110)
Creat: 1.18 mg/dL — ABNORMAL HIGH (ref 0.70–1.11)
GFR, EST AFRICAN AMERICAN: 61 mL/min/{1.73_m2} (ref >=60)
GFR, EST NON AFRICAN AMERICAN: 53 mL/min/{1.73_m2} — AB (ref >=60)
GLOBULIN: 2.8 g/dL (ref 1.9–3.7)
Glucose, Bld: 112 mg/dL — ABNORMAL HIGH (ref 65–99)
POTASSIUM: 5.3 mmol/L (ref 3.5–5.3)
SODIUM: 145 mmol/L (ref 135–146)
TOTAL PROTEIN: 6.5 g/dL (ref 6.1–8.1)
Total Bilirubin: 0.6 mg/dL (ref 0.2–1.2)

## 2018-02-28 LAB — TEST AUTHORIZATION

## 2018-02-28 LAB — IRON, TOTAL/TOTAL IRON BINDING CAP
%SAT: 28 % (ref 20–48)
Iron: 86 ug/dL (ref 50–180)
TIBC: 306 mcg/dL (calc) (ref 250–425)

## 2018-02-28 LAB — TSH: TSH: 2.14 mIU/L (ref 0.40–4.50)

## 2018-03-17 DIAGNOSIS — R29818 Other symptoms and signs involving the nervous system: Secondary | ICD-10-CM | POA: Diagnosis not present

## 2018-03-17 DIAGNOSIS — I672 Cerebral atherosclerosis: Secondary | ICD-10-CM | POA: Diagnosis not present

## 2018-03-17 DIAGNOSIS — I6601 Occlusion and stenosis of right middle cerebral artery: Secondary | ICD-10-CM | POA: Diagnosis not present

## 2018-03-17 DIAGNOSIS — R0602 Shortness of breath: Secondary | ICD-10-CM | POA: Diagnosis not present

## 2018-03-17 DIAGNOSIS — I639 Cerebral infarction, unspecified: Secondary | ICD-10-CM | POA: Diagnosis not present

## 2018-03-17 DIAGNOSIS — R001 Bradycardia, unspecified: Secondary | ICD-10-CM | POA: Diagnosis not present

## 2018-03-17 DIAGNOSIS — R2689 Other abnormalities of gait and mobility: Secondary | ICD-10-CM | POA: Diagnosis not present

## 2018-03-17 DIAGNOSIS — R29704 NIHSS score 4: Secondary | ICD-10-CM | POA: Diagnosis not present

## 2018-03-17 DIAGNOSIS — G459 Transient cerebral ischemic attack, unspecified: Secondary | ICD-10-CM | POA: Diagnosis not present

## 2018-03-17 DIAGNOSIS — N400 Enlarged prostate without lower urinary tract symptoms: Secondary | ICD-10-CM | POA: Diagnosis present

## 2018-03-17 DIAGNOSIS — Z8673 Personal history of transient ischemic attack (TIA), and cerebral infarction without residual deficits: Secondary | ICD-10-CM | POA: Diagnosis not present

## 2018-03-17 DIAGNOSIS — Z9049 Acquired absence of other specified parts of digestive tract: Secondary | ICD-10-CM | POA: Diagnosis not present

## 2018-03-17 DIAGNOSIS — R29711 NIHSS score 11: Secondary | ICD-10-CM | POA: Diagnosis present

## 2018-03-17 DIAGNOSIS — I083 Combined rheumatic disorders of mitral, aortic and tricuspid valves: Secondary | ICD-10-CM | POA: Diagnosis not present

## 2018-03-17 DIAGNOSIS — I6781 Acute cerebrovascular insufficiency: Secondary | ICD-10-CM | POA: Diagnosis not present

## 2018-03-17 DIAGNOSIS — I6621 Occlusion and stenosis of right posterior cerebral artery: Secondary | ICD-10-CM | POA: Diagnosis not present

## 2018-03-17 DIAGNOSIS — I4891 Unspecified atrial fibrillation: Secondary | ICD-10-CM | POA: Diagnosis not present

## 2018-03-17 DIAGNOSIS — Z9282 Status post administration of tPA (rtPA) in a different facility within the last 24 hours prior to admission to current facility: Secondary | ICD-10-CM | POA: Diagnosis not present

## 2018-03-17 DIAGNOSIS — G8194 Hemiplegia, unspecified affecting left nondominant side: Secondary | ICD-10-CM | POA: Diagnosis present

## 2018-03-17 DIAGNOSIS — R270 Ataxia, unspecified: Secondary | ICD-10-CM | POA: Diagnosis present

## 2018-03-17 DIAGNOSIS — I10 Essential (primary) hypertension: Secondary | ICD-10-CM | POA: Diagnosis present

## 2018-03-17 DIAGNOSIS — I63511 Cerebral infarction due to unspecified occlusion or stenosis of right middle cerebral artery: Secondary | ICD-10-CM | POA: Diagnosis not present

## 2018-03-17 DIAGNOSIS — I480 Paroxysmal atrial fibrillation: Secondary | ICD-10-CM | POA: Diagnosis not present

## 2018-03-17 DIAGNOSIS — I6930 Unspecified sequelae of cerebral infarction: Secondary | ICD-10-CM | POA: Diagnosis not present

## 2018-03-17 DIAGNOSIS — R4781 Slurred speech: Secondary | ICD-10-CM | POA: Diagnosis present

## 2018-03-17 DIAGNOSIS — K5790 Diverticulosis of intestine, part unspecified, without perforation or abscess without bleeding: Secondary | ICD-10-CM | POA: Diagnosis present

## 2018-03-17 DIAGNOSIS — I669 Occlusion and stenosis of unspecified cerebral artery: Secondary | ICD-10-CM | POA: Diagnosis not present

## 2018-03-17 DIAGNOSIS — I63539 Cerebral infarction due to unspecified occlusion or stenosis of unspecified posterior cerebral artery: Secondary | ICD-10-CM | POA: Diagnosis present

## 2018-03-17 DIAGNOSIS — R29810 Facial weakness: Secondary | ICD-10-CM | POA: Diagnosis present

## 2018-03-19 DIAGNOSIS — I480 Paroxysmal atrial fibrillation: Secondary | ICD-10-CM | POA: Insufficient documentation

## 2018-03-26 ENCOUNTER — Other Ambulatory Visit: Payer: Self-pay | Admitting: Internal Medicine

## 2018-03-26 ENCOUNTER — Ambulatory Visit (INDEPENDENT_AMBULATORY_CARE_PROVIDER_SITE_OTHER): Payer: Medicare Other | Admitting: Internal Medicine

## 2018-03-26 VITALS — BP 136/58 | HR 60 | Temp 97.7°F | Resp 16 | Ht 71.25 in | Wt 186.8 lb

## 2018-03-26 DIAGNOSIS — I63332 Cerebral infarction due to thrombosis of left posterior cerebral artery: Secondary | ICD-10-CM

## 2018-03-26 DIAGNOSIS — R7309 Other abnormal glucose: Secondary | ICD-10-CM

## 2018-03-26 DIAGNOSIS — I480 Paroxysmal atrial fibrillation: Secondary | ICD-10-CM | POA: Diagnosis not present

## 2018-03-26 DIAGNOSIS — I10 Essential (primary) hypertension: Secondary | ICD-10-CM | POA: Diagnosis not present

## 2018-03-26 DIAGNOSIS — R7303 Prediabetes: Secondary | ICD-10-CM | POA: Diagnosis not present

## 2018-03-26 DIAGNOSIS — I2510 Atherosclerotic heart disease of native coronary artery without angina pectoris: Secondary | ICD-10-CM | POA: Diagnosis not present

## 2018-03-26 DIAGNOSIS — E782 Mixed hyperlipidemia: Secondary | ICD-10-CM

## 2018-03-26 DIAGNOSIS — Z79899 Other long term (current) drug therapy: Secondary | ICD-10-CM | POA: Diagnosis not present

## 2018-03-26 DIAGNOSIS — E559 Vitamin D deficiency, unspecified: Secondary | ICD-10-CM | POA: Diagnosis not present

## 2018-03-26 NOTE — Patient Instructions (Signed)
Stroke Prevention °Some medical conditions and behaviors are associated with a higher chance of having a stroke. You can help prevent a stroke by making nutrition, lifestyle, and other changes, including managing any medical conditions you may have. °What nutrition changes can be made? °· Eat healthy foods. You can do this by: °? Choosing foods high in fiber, such as fresh fruits and vegetables and whole grains. °? Eating at least 5 or more servings of fruits and vegetables a day. Try to fill half of your plate at each meal with fruits and vegetables. °? Choosing lean protein foods, such as lean cuts of meat, poultry without skin, fish, tofu, beans, and nuts. °? Eating low-fat dairy products. °? Avoiding foods that are high in salt (sodium). This can help lower blood pressure. °? Avoiding foods that have saturated fat, trans fat, and cholesterol. This can help prevent high cholesterol. °? Avoiding processed and premade foods. °· Follow your health care provider's specific guidelines for losing weight, controlling high blood pressure (hypertension), lowering high cholesterol, and managing diabetes. These may include: °? Reducing your daily calorie intake. °? Limiting your daily sodium intake to 1,500 milligrams (mg). °? Using only healthy fats for cooking, such as olive oil, canola oil, or sunflower oil. °? Counting your daily carbohydrate intake. °What lifestyle changes can be made? °· Maintain a healthy weight. Talk to your health care provider about your ideal weight. °· Get at least 30 minutes of moderate physical activity at least 5 days a week. Moderate activity includes brisk walking, biking, and swimming. °· Do not use any products that contain nicotine or tobacco, such as cigarettes and e-cigarettes. If you need help quitting, ask your health care provider. It may also be helpful to avoid exposure to secondhand smoke. °· Limit alcohol intake to no more than 1 drink a day for nonpregnant women and 2 drinks a  day for men. One drink equals 12 oz of beer, 5 oz of wine, or 1½ oz of hard liquor. °· Stop any illegal drug use. °· Avoid taking birth control pills. Talk to your health care provider about the risks of taking birth control pills if: °? You are over 35 years old. °? You smoke. °? You get migraines. °? You have ever had a blood clot. °What other changes can be made? °· Manage your cholesterol levels. °? Eating a healthy diet is important for preventing high cholesterol. If cholesterol cannot be managed through diet alone, you may also need to take medicines. °? Take any prescribed medicines to control your cholesterol as told by your health care provider. °· Manage your diabetes. °? Eating a healthy diet and exercising regularly are important parts of managing your blood sugar. If your blood sugar cannot be managed through diet and exercise, you may need to take medicines. °? Take any prescribed medicines to control your diabetes as told by your health care provider. °· Control your hypertension. °? To reduce your risk of stroke, try to keep your blood pressure below 130/80. °? Eating a healthy diet and exercising regularly are an important part of controlling your blood pressure. If your blood pressure cannot be managed through diet and exercise, you may need to take medicines. °? Take any prescribed medicines to control hypertension as told by your health care provider. °? Ask your health care provider if you should monitor your blood pressure at home. °? Have your blood pressure checked every year, even if your blood pressure is normal. Blood pressure increases with   age and some medical conditions. °· Get evaluated for sleep disorders (sleep apnea). Talk to your health care provider about getting a sleep evaluation if you snore a lot or have excessive sleepiness. °· Take over-the-counter and prescription medicines only as told by your health care provider. Aspirin or blood thinners (antiplatelets or  anticoagulants) may be recommended to reduce your risk of forming blood clots that can lead to stroke. °· Make sure that any other medical conditions you have, such as atrial fibrillation or atherosclerosis, are managed. °What are the warning signs of a stroke? °The warning signs of a stroke can be easily remembered as BEFAST. °· B is for balance. Signs include: °? Dizziness. °? Loss of balance or coordination. °? Sudden trouble walking. °· E is for eyes. Signs include: °? A sudden change in vision. °? Trouble seeing. °· F is for face. Signs include: °? Sudden weakness or numbness of the face. °? The face or eyelid drooping to one side. °· A is for arms. Signs include: °? Sudden weakness or numbness of the arm, usually on one side of the body. °· S is for speech. Signs include: °? Trouble speaking (aphasia). °? Trouble understanding. °· T is for time. °? These symptoms may represent a serious problem that is an emergency. Do not wait to see if the symptoms will go away. Get medical help right away. Call your local emergency services (911 in the [place] Do not drive yourself to the hospital. °· Other signs of stroke may include: °? A sudden, severe headache with no known cause. °? Nausea or vomiting. °? Seizure. ° °Where to find more information: °For more information, visit: °· American Stroke Association: www.strokeassociation.org °· National Stroke Association: www.stroke.org ° °Summary °· You can prevent a stroke by eating healthy, exercising, not smoking, limiting alcohol intake, and managing any medical conditions you may have. °· Do not use any products that contain nicotine or tobacco, such as cigarettes and e-cigarettes. If you need help quitting, ask your health care provider. It may also be helpful to avoid exposure to secondhand smoke. °· Remember BEFAST for warning signs of stroke. Get help right away if you or a loved one has any of these signs. °This information is not intended to replace advice given  to you by your health care provider. Make sure you discuss any questions you have with your health care provider. °Document Released: 09/01/2004 Document Revised: 08/30/2016 Document Reviewed: 08/30/2016 °Elsevier Interactive Patient Education © 2018 Elsevier Inc. ° °

## 2018-03-26 NOTE — Progress Notes (Signed)
Joseph Cherry     This very nice 84 y.o. WWM was admitted to the hospital on 03/17/2018 and patient was discharged from the hospital on 03/19/2018. The patient now presents for follow up for transition from recent hospitalization.  The day after discharge  our clinical staff contacted the patient to assure stability and schedule a follow up appointment. The discharge summary, medications and diagnostic test results were reviewed before meeting with the patient (and currently are unavailable as have been sent to scan). The patient was admitted for:   Cerebrovascular accident (CVA) due to thrombosis of left posterior cerebral artery ([place]) Essential hypertension Paroxysmal atrial fibrillation  ASHD     Apparently while visiting in [place], patient developed a Lt facial paresis, confusion and dysarthria, his daughter -in-law, previous CCU nurse, transported him to [facility] (?) [place] where he received TPA with rapid recovery of his sx's. CT scan per dau-in-law showed thrombotic Rt parietal thrombotic CVA and MRI  the following day was clear. Per report a Cardiologist - Dr Carmina Miller saw patient for a question of Afib and possible AFlutter and recommended Eliquis  And f/u with Dr Jens Som for further evaluation. Consequent of patient's advancing Dementia and poor short term menory, he has no recall of his hospitalization. In the week since his hospitalization he has returned home to independent living with close supervision by his children.      Hospitalization discharge instructions and medications are reconciled with the patient & family.     Patient is also followed with Hypertension, Hyperlipidemia, Pre-Diabetes and Vitamin D Deficiency.      Patient is treated for HTN (1998) & BP has been controlled at home. Today's BP is at goal - 136/58. Patient also has a very remote hx/o pSVT with no palpitations on his current med regimen. Patient has had no complaints of any cardiac  type chest pain, palpitations, dyspnea/orthopnea/PND, dizziness, claudication, or dependent edema.     Hyperlipidemia has been controlled with diet & meds. Patient denies myalgias or other med SE's. Last Lipids were at goal: Lab Results  Component Value Date   CHOL 159 10/31/2017   HDL 53 10/31/2017   LDLCALC 87 10/31/2017   TRIG 97 10/31/2017   CHOLHDL 3.0 10/31/2017      Also, the patient has history of PreDiabetes (A1c 5.8%/2011) and has had no symptoms of reactive hypoglycemia, diabetic polys, paresthesias or visual blurring.  Last A1c was Normal & at goal: Lab Results  Component Value Date   HGBA1C 4.9 10/31/2017      Further, the patient also has history of Vitamin D Deficiency ("30"/2008) and supplements vitamin D without any suspected side-effects. Last vitamin D was   Lab Results  Component Value Date   VD25OH 47 10/31/2017   Current Outpatient Medications on File Prior to Visit  Medication Sig  . aspirin EC 81 MG tablet Take 81 mg by mouth daily.  Marland Kitchen atorvastatin (LIPITOR) 40 MG tablet Take 40 mg by mouth daily.  . Cholecalciferol (VITAMIN D) 2000 units CAPS Take 1 capsule by mouth daily.  Marland Kitchen ELIQUIS 5 MG TABS tablet Take 5 mg by mouth 2 (two) times daily.  . finasteride (PROSCAR) 5 MG tablet Take 1 tablet (5 mg total) by mouth daily. In AM  . losartan (COZAAR) 100 MG tablet TAKE 1/2 TO 1 TABLET DAILY FOR BLOOD PRESSURE.  . mirtazapine (REMERON) 30 MG tablet Take 1 tablet at bedtime for appetite  . montelukast (SINGULAIR) 10 MG tablet TAKE  1 TABLET ONCE A DAY FOR ALLERGIES.  . Multiple Vitamin (MULTIVITAMIN) tablet Take 1 tablet by mouth daily.  . ranitidine (ZANTAC) 300 MG tablet TAKE ONE TABLET AT BEDTIME.   No current facility-administered medications on file prior to visit.    Allergies  Allergen Reactions  . Demerol [Meperidine]   . Penicillins    PMHx:   Past Medical History:  Diagnosis Date  . ASHD (arteriosclerotic heart disease)   . Hypertension   .  Prediabetes   . Vitamin D deficiency    Immunization History  Administered Date(s) Administered  . Influenza Split 06/03/2013  . Influenza, High Dose Seasonal PF 05/20/2014, 05/05/2015, 06/06/2016, 07/10/2017  . Pneumococcal Conjugate-13 03/02/2016  . Pneumococcal Polysaccharide-23 10/27/2014  . Td 08/09/2003, 02/27/2018  . Zoster 08/25/2010   Past Surgical History:  Procedure Laterality Date  . CATARACT EXTRACTION W/ INTRAOCULAR LENS IMPLANT Right 1993  . CATARACT EXTRACTION W/ INTRAOCULAR LENS IMPLANT Left 2001  . CHOLECYSTECTOMY  2001  . COLECTOMY  1992   FHx:    Reviewed / unchanged  SHx:    Reviewed / unchanged  Systems Review:  Constitutional: Denies fever, chills, wt changes, headaches, insomnia, fatigue, night sweats, change in appetite. Eyes: Denies redness, blurred vision, diplopia, discharge, itchy, watery eyes.  ENT: Denies discharge, congestion, post nasal drip, epistaxis, sore throat, earache, hearing loss, dental pain, tinnitus, vertigo, sinus pain, snoring.  CV: Denies chest pain, palpitations, irregular heartbeat, syncope, dyspnea, diaphoresis, orthopnea, PND, claudication or edema. Respiratory: denies cough, dyspnea, DOE, pleurisy, hoarseness, laryngitis, wheezing.  Gastrointestinal: Denies dysphagia, odynophagia, heartburn, reflux, water brash, abdominal pain or cramps, nausea, vomiting, bloating, diarrhea, constipation, hematemesis, melena, hematochezia  or hemorrhoids. Genitourinary: Denies dysuria, frequency, urgency, nocturia, hesitancy, discharge, hematuria or flank pain. Musculoskeletal: Denies arthralgias, myalgias, stiffness, jt. swelling, pain, limping or strain/sprain.  Skin: Denies pruritus, rash, hives, warts, acne, eczema or change in skin lesion(s). Neuro: No weakness, tremor, incoordination, spasms, paresthesia or pain. Psychiatric: Denies confusion, memory loss or sensory loss. Endo: Denies change in weight, skin or hair change.  Heme/Lymph: No  excessive bleeding, bruising or enlarged lymph nodes.  Physical Exam  BP (!) 136/58   Pulse 60   Temp 97.7 F (36.5 C)   Resp 16   Ht 5' 11.25" (1.81 m)   Wt 186 lb 12.8 oz (84.7 kg)   BMI 25.87 kg/m   Appears well nourished, well groomed  and in no distress.  Eyes: PERRLA, EOMs, conjunctiva no swelling or erythema. Sinuses: No frontal/maxillary tenderness ENT/Mouth: EAC's clear, TM's nl w/o erythema, bulging. Nares clear w/o erythema, swelling, exudates. Oropharynx clear without erythema or exudates. Oral hygiene is good. Tongue normal, non obstructing. Hearing intact.  Neck: Supple. Thyroid nl. Car 2+/2+ without bruits, nodes or JVD. Chest: Respirations nl with BS clear & equal w/o rales, rhonchi, wheezing or stridor.  Cor: Heart sounds normal w/ regular rate and rhythm without sig. murmurs, gallops, clicks or rubs. Peripheral pulses normal and equal  without edema.  Abdomen: Soft & bowel sounds normal. Non-tender w/o guarding, rebound, hernias, masses or organomegaly.  Lymphatics: Unremarkable.  Musculoskeletal: Full ROM all peripheral extremities, joint stability, 5/5 strength and normal gait.  Skin: Warm, dry without exposed rashes, lesions or ecchymosis apparent.  Neuro: Cranial nerves intact, reflexes equal bilaterally. Sensory-motor testing grossly intact. Tendon reflexes grossly intact.  Pysch: Alert & oriented x 3.  Insight and judgement nl & appropriate. No ideations.  Assessment and Plan:  1. Cerebrovascular accident (CVA) due to thrombosis of left  posterior cerebral artery ([place])  2. Essential hypertension  - Continue medication, monitor blood pressure at home.  - Continue DASH diet. Reminder to go to the ER if any CP,  SOB, nausea, dizziness, severe HA, changes vision/speech.  - CBC with Differential/Platelet - COMPLETE METABOLIC PANEL WITH GFR - Magnesium - TSH  3. Paroxysmal atrial fibrillation ([facility])  - TSH - Ambulatory referral to Cardiology  4. ASHD  (arteriosclerotic heart disease)  - Ambulatory referral to Cardiology  5. Hyperlipidemia, mixed  - Continue diet, exercise, lifestyle modifications.  - Monitor appropriate labs.  6. Abnormal glucose   7. Vitamin D deficiency  - Continue supplementation.   8. Prediabetes  - Continue diet, exercise, lifestyle modifications.  - Monitor appropriate labs.  9. Medication management  - CBC with Differential/Platelet - COMPLETE METABOLIC PANEL WITH GFR - Magnesium - TSH     Discussed  regular exercise, BP monitoring, weight control to achieve/maintain BMI less than 25 and discussed meds and SE's. Recommended labs to assess and monitor clinical status with further disposition pending results of labs. Over 45 minutes of exam, counseling, chart review and  critical decision making was performed

## 2018-03-27 ENCOUNTER — Ambulatory Visit: Payer: Self-pay

## 2018-03-27 ENCOUNTER — Other Ambulatory Visit: Payer: Self-pay | Admitting: Internal Medicine

## 2018-03-27 DIAGNOSIS — E875 Hyperkalemia: Secondary | ICD-10-CM

## 2018-03-27 LAB — CBC WITH DIFFERENTIAL/PLATELET
BASOS PCT: 0.4 %
Basophils Absolute: 28 cells/uL (ref 0–200)
EOS ABS: 149 {cells}/uL (ref 15–500)
EOS PCT: 2.1 %
HCT: 37.4 % — ABNORMAL LOW (ref 38.5–50.0)
Hemoglobin: 12.3 g/dL — ABNORMAL LOW (ref 13.2–17.1)
Lymphs Abs: 1832 cells/uL (ref 850–3900)
MCH: 31.7 pg (ref 27.0–33.0)
MCHC: 32.9 g/dL (ref 32.0–36.0)
MCV: 96.4 fL (ref 80.0–100.0)
MONOS PCT: 10 %
MPV: 10.7 fL (ref 7.5–12.5)
Neutro Abs: 4381 cells/uL (ref 1500–7800)
Neutrophils Relative %: 61.7 %
PLATELETS: 266 10*3/uL (ref 140–400)
RBC: 3.88 10*6/uL — ABNORMAL LOW (ref 4.20–5.80)
RDW: 11.8 % (ref 11.0–15.0)
TOTAL LYMPHOCYTE: 25.8 %
WBC mixed population: 710 cells/uL (ref 200–950)
WBC: 7.1 10*3/uL (ref 3.8–10.8)

## 2018-03-27 LAB — COMPLETE METABOLIC PANEL WITH GFR
AG Ratio: 1.6 (calc) (ref 1.0–2.5)
ALBUMIN MSPROF: 4 g/dL (ref 3.6–5.1)
ALKALINE PHOSPHATASE (APISO): 69 U/L (ref 40–115)
ALT: 10 U/L (ref 9–46)
AST: 16 U/L (ref 10–35)
BILIRUBIN TOTAL: 0.7 mg/dL (ref 0.2–1.2)
BUN / CREAT RATIO: 15 (calc) (ref 6–22)
BUN: 21 mg/dL (ref 7–25)
CHLORIDE: 109 mmol/L (ref 98–110)
CO2: 28 mmol/L (ref 20–32)
CREATININE: 1.36 mg/dL — AB (ref 0.70–1.11)
Calcium: 10 mg/dL (ref 8.6–10.3)
GFR, Est African American: 51 mL/min/{1.73_m2} — ABNORMAL LOW (ref >=60)
GFR, Est Non African American: 44 mL/min/{1.73_m2} — ABNORMAL LOW (ref >=60)
GLUCOSE: 136 mg/dL — AB (ref 65–99)
Globulin: 2.5 g/dL (calc) (ref 1.9–3.7)
Potassium: 6.1 mmol/L — ABNORMAL HIGH (ref 3.5–5.3)
SODIUM: 146 mmol/L (ref 135–146)
Total Protein: 6.5 g/dL (ref 6.1–8.1)

## 2018-03-27 LAB — TSH: TSH: 3.88 m[IU]/L (ref 0.40–4.50)

## 2018-03-27 LAB — MAGNESIUM: MAGNESIUM: 2.2 mg/dL (ref 1.5–2.5)

## 2018-03-29 ENCOUNTER — Other Ambulatory Visit: Payer: Medicare Other

## 2018-03-29 DIAGNOSIS — E875 Hyperkalemia: Secondary | ICD-10-CM

## 2018-03-30 LAB — BASIC METABOLIC PANEL WITH GFR
BUN/Creatinine Ratio: 15 (calc) (ref 6–22)
BUN: 20 mg/dL (ref 7–25)
CALCIUM: 9.6 mg/dL (ref 8.6–10.3)
CO2: 28 mmol/L (ref 20–32)
Chloride: 109 mmol/L (ref 98–110)
Creat: 1.37 mg/dL — ABNORMAL HIGH (ref 0.70–1.11)
GFR, EST NON AFRICAN AMERICAN: 44 mL/min/{1.73_m2} — AB (ref >=60)
GFR, Est African American: 51 mL/min/{1.73_m2} — ABNORMAL LOW (ref >=60)
Glucose, Bld: 116 mg/dL — ABNORMAL HIGH (ref 65–99)
Potassium: 5.3 mmol/L (ref 3.5–5.3)
Sodium: 145 mmol/L (ref 135–146)

## 2018-04-18 ENCOUNTER — Ambulatory Visit (INDEPENDENT_AMBULATORY_CARE_PROVIDER_SITE_OTHER): Payer: Medicare Other | Admitting: Internal Medicine

## 2018-04-18 ENCOUNTER — Encounter: Payer: Self-pay | Admitting: Internal Medicine

## 2018-04-18 VITALS — BP 136/70 | HR 58 | Ht 71.25 in | Wt 184.6 lb

## 2018-04-18 DIAGNOSIS — Z8673 Personal history of transient ischemic attack (TIA), and cerebral infarction without residual deficits: Secondary | ICD-10-CM | POA: Diagnosis not present

## 2018-04-18 DIAGNOSIS — I480 Paroxysmal atrial fibrillation: Secondary | ICD-10-CM | POA: Diagnosis not present

## 2018-04-18 NOTE — Progress Notes (Signed)
ELECTROPHYSIOLOGY CONSULT NOTE  Patient ID: Joseph Cherry, MRN: [id number], DOB/AGE: 09-11-1921 84 y.o. Admit date: (Not on file) Date of Consult: 04/18/2018  Primary Physician: Unk Pinto, MD Primary Cardiologist: new     Joseph Cherry is a 84 y.o. male who is being seen today for the evaluation of afib at the request of Dr Melford Aase.    HPI Joseph Cherry is a 84 y.o. male referred for atrial fibrillation identified during recent hospitalization for acute stroke.  He has a history of remote stroke in 2017 identified on brain imaging following complaints of memory loss.  Most recently, he was with his daughter in law who is a Marine scientist and noted abrupt facial droop and aphasia.  She drove him to [facility] with a code stroke alert.  He was given TPA and within 45 minutes his symptoms began to resolve.  He was discharged 48 hours later.  Echocardiogram demonstrated normal LV function and mild left atrial enlargement and ECG demonstrated atrial fibrillation.  He was started on Eliquis.  He has no palpitations.  He has no issues of exercise intolerance.  He has moderate dementia  He has had more fatigue since remeron   Thromboembolic risk factors ( age -68, HTN-1, TIA/CVA-2) for a CHADSVASc Score of 5  Past Medical History:  Diagnosis Date  . ASHD (arteriosclerotic heart disease)   . Hypertension   . Prediabetes   . Vitamin D deficiency       Surgical History:  Past Surgical History:  Procedure Laterality Date  . CATARACT EXTRACTION W/ INTRAOCULAR LENS IMPLANT Right 1993  . CATARACT EXTRACTION W/ INTRAOCULAR LENS IMPLANT Left 2001  . CHOLECYSTECTOMY  2001  . COLECTOMY  1992     Home Meds: Prior to Admission medications   Medication Sig Start Date End Date Taking? Authorizing Provider  bisoprolol (ZEBETA) 5 MG tablet TAKE 1 TABLET ONCE DAILY. 03/26/18  Yes Liane Comber, NP  Cholecalciferol (VITAMIN D) 2000 units CAPS Take 1 capsule by mouth daily.   Yes [provider]  ELIQUIS 5 MG TABS tablet Take 5 mg by mouth 2 (two) times daily. 03/19/18  Yes [provider]  finasteride (PROSCAR) 5 MG tablet Take 1 tablet (5 mg total) by mouth daily. In AM 10/31/17  Yes Unk Pinto, MD  losartan (COZAAR) 100 MG tablet TAKE 1/2 TO 1 TABLET DAILY FOR BLOOD PRESSURE. 01/15/18  Yes Liane Comber, NP  mirtazapine (REMERON) 30 MG tablet Take 1 tablet at bedtime for appetite 10/31/17  Yes Unk Pinto, MD  montelukast (SINGULAIR) 10 MG tablet TAKE 1 TABLET ONCE A DAY FOR ALLERGIES. 06/05/17  Yes Vicie Mutters, PA-C  Multiple Vitamin (MULTIVITAMIN) tablet Take 1 tablet by mouth daily.   Yes [provider]  ranitidine (ZANTAC) 300 MG tablet TAKE ONE TABLET AT BEDTIME. 01/15/18  Yes Corbett, Caryl Pina, NP  verapamil (CALAN-SR) 240 MG CR tablet TAKE 1 TABLET TWICE DAILY AFTER MEALS FOR HEART RHYTHM. 03/26/18  Yes Liane Comber, NP    Allergies:  Allergies  Allergen Reactions  . Demerol [Meperidine]   . Penicillins     Social History   Socioeconomic History  . Marital status: Widowed    Spouse name: Not on file  . Number of children: Not on file  . Years of education: Not on file  . Highest education level: Not on file  Occupational History  . Not on file  Social Needs  . Financial resource strain: Not on file  .  Food insecurity:    Worry: Not on file    Inability: Not on file  . Transportation needs:    Medical: Not on file    Non-medical: Not on file  Tobacco Use  . Smoking status: Former Smoker    Types: Cigars    Last attempt to quit: 06/23/2014    Years since quitting: 3.8  . Smokeless tobacco: Never Used  Substance and Sexual Activity  . Alcohol use: Yes    Alcohol/week: 7.0 standard drinks    Types: 7 Standard drinks or equivalent per week    Comment: occ  . Drug use: No  . Sexual activity: Not on file  Lifestyle  . Physical activity:    Days per week: Not on file    Minutes per session: Not on file  .  Stress: Not on file  Relationships  . Social connections:    Talks on phone: Not on file    Gets together: Not on file    Attends religious service: Not on file    Active member of club or organization: Not on file    Attends meetings of clubs or organizations: Not on file    Relationship status: Not on file  . Intimate partner violence:    Fear of current or ex partner: Not on file    Emotionally abused: Not on file    Physically abused: Not on file    Forced sexual activity: Not on file  Other Topics Concern  . Not on file  Social History Narrative  . Not on file     Family History  Problem Relation Age of Onset  . AAA (abdominal aortic aneurysm) Mother   . Heart disease Mother   . Hypertension Mother   . Heart disease Father   . Heart disease Sister   . Early death Brother        at birth     ROS:  Please see the history of present illness.     All other systems reviewed and negative.    Physical Exam:  Blood pressure 136/70, pulse (!) 58, height 5' 11.25" (1.81 m), weight 184 lb 9.6 oz (83.7 kg), SpO2 95 %. General: Well developed, well nourished male in no acute distress. Head: Normocephalic, atraumatic, sclera non-icteric, no xanthomas, nares are without discharge. EENT: normal  Lymph Nodes:  none Neck: Negative for carotid bruits. JVD not elevated. Back:without scoliosis kyphosis Lungs: Clear bilaterally to auscultation without wheezes, rales, or rhonchi. Breathing is unlabored. Heart: slow RRR with S1 S2. 2/6 murmur . No rubs, or gallops appreciated. Abdomen: Soft, non-tender, non-distended with normoactive bowel sounds. No hepatomegaly. No rebound/guarding. No obvious abdominal masses. Msk:  Strength and tone appear normal for age. Extremities: No clubbing or cyanosis. No  edema.  Distal pedal pulses are 2+ and equal bilaterally. Skin: Warm and Dry Neuro: Alert and oriented X 1. CN III-XII intact Grossly normal sensory and motor function . Psych:  Responds to  questions appropriately with a normal affect.      Labs: Cardiac Enzymes No results for input(s): CKTOTAL, CKMB, TROPONINI in the last 72 hours. CBC Lab Results  Component Value Date   WBC 7.1 03/26/2018   HGB 12.3 (L) 03/26/2018   HCT 37.4 (L) 03/26/2018   MCV 96.4 03/26/2018   PLT 266 03/26/2018   PROTIME: No results for input(s): LABPROT, INR in the last 72 hours. Chemistry No results for input(s): NA, K, CL, CO2, BUN, CREATININE, CALCIUM, PROT, BILITOT, ALKPHOS, ALT, AST,  GLUCOSE in the last 168 hours.  Invalid input(s): LABALBU Lipids Lab Results  Component Value Date   CHOL 159 10/31/2017   HDL 53 10/31/2017   LDLCALC 87 10/31/2017   TRIG 97 10/31/2017   BNP No results found for: PROBNP Thyroid Function Tests: No results for input(s): TSH, T4TOTAL, T3FREE, THYROIDAB in the last 72 hours.  Invalid input(s): FREET3 Miscellaneous No results found for: DDIMER  Radiology/Studies:  No results found.  EKG: Atrial rhythm at 58 with a low focus Intervals 28/09/45   Assessment and Plan:  Atrial fibrillation-paroxysmal  Stroke  Fatigue   hypertension   Patient had a stroke and is appropriately anticoagulated with apixaban. (CR 1.0--8/19)  He has no attributable symptoms to his atrial fibrillation nor to his relative bradycardia.  Hence, would continue his current medications if not withstanding the mild bradycardia.  His fatigue has been worse since the Remeron.  Suggested that they stop it and see how he does.  Stroke can also be associated with fatigue.  He would like to see [place] on an as-needed basis.   Virl Axe

## 2018-04-18 NOTE — Patient Instructions (Signed)
Medication Instructions:  Your physician recommends that you continue on your current medications as directed. Please refer to the Current Medication list given to you today.  Labwork: None ordered.  Testing/Procedures: None ordered.  Follow-Up: Your physician wants you to follow-up as needed with Dr Caryl Comes.  Any Other Special Instructions Will Be Listed Below (If Applicable).     If you need a refill on your cardiac medications before your next appointment, please call your pharmacy.

## 2018-04-26 ENCOUNTER — Ambulatory Visit: Payer: Self-pay | Admitting: Internal Medicine

## 2018-05-01 ENCOUNTER — Other Ambulatory Visit: Payer: Self-pay | Admitting: Adult Health

## 2018-05-16 ENCOUNTER — Encounter: Payer: Self-pay | Admitting: Internal Medicine

## 2018-05-17 ENCOUNTER — Encounter: Payer: Self-pay | Admitting: Internal Medicine

## 2018-05-22 ENCOUNTER — Other Ambulatory Visit: Payer: Self-pay | Admitting: Adult Health

## 2018-06-07 ENCOUNTER — Encounter: Payer: Self-pay | Admitting: Internal Medicine

## 2018-06-07 NOTE — Progress Notes (Signed)
[facility]   Unk Pinto, M.D.     Uvaldo Bristle. Silverio Lay, P.A.-C Liane Comber, [address]                [place] [address] Telephone [phone] Telefax [phone] Annual  Screening/Preventative Visit  & Comprehensive Evaluation & Examination     This very nice 84 y.o. WWM presents for a Screening /Preventative Visit & comprehensive evaluation and management of multiple medical co-morbidities.  Patient has been followed for HTN, ASHD, HLD, Prediabetes and Vitamin D Deficiency. Patient has mild vascular dementia & is monitored closely by his children. He also has GERD controlled on his meds.     HTN predates circa 1998. Patient's BP has been controlled at home.  Today's BP is at goal - 132/66.  Patient has hx/o remote CVA in 2017 and more recently in Aug 2019, he had a Lt Brain CVA rescued with TPA with recovery and he had pAfib & was started on Eliquis (CHADsVASc 5). He had evaluation in Sept by Dr Caryl Comes recommending continuing current therapy. Patient denies any cardiac symptoms as chest pain, palpitations, shortness of breath, dizziness or ankle swelling.     Patient's hyperlipidemia is controlled with diet and medications. Patient denies myalgias or other medication SE's. Last lipids were at goal: Lab Results  Component Value Date   CHOL 153 06/08/2018   HDL 53 06/08/2018   LDLCALC 81 06/08/2018   TRIG 95 06/08/2018   CHOLHDL 2.9 06/08/2018      Patient has hx/o prediabetes  (A1c 5.7% / 2011) and patient denies reactive hypoglycemic symptoms, visual blurring, diabetic polys or paresthesias. Last A1c was Normal & at goal: Lab Results  Component Value Date   HGBA1C 5.1 06/08/2018       Finally, patient has history of Vitamin D Deficiency ("30" / 2008) and last vitamin D was low (goal 70-100): Lab Results  Component Value Date   VD25OH 54 06/08/2018   Current  Outpatient Medications on File Prior to Visit  Medication Sig  . bisoprolol (ZEBETA) 5 MG tablet TAKE 1 TABLET ONCE DAILY.  Marland Kitchen Cholecalciferol (VITAMIN D) 2000 units CAPS Take 1 capsule by mouth daily.  Marland Kitchen ELIQUIS 5 MG TABS tablet Take 5 mg by mouth 2 (two) times daily.  . finasteride (PROSCAR) 5 MG tablet Take 1 tablet (5 mg total) by mouth daily. In AM  . losartan (COZAAR) 100 MG tablet TAKE 1/2 TO 1 TABLET DAILY FOR BLOOD PRESSURE.  . Multiple Vitamin (MULTIVITAMIN) tablet Take 1 tablet by mouth daily.  . verapamil (CALAN-SR) 240 MG CR tablet TAKE 1 TABLET TWICE DAILY AFTER MEALS FOR HEART RHYTHM.   No current facility-administered medications on file prior to visit.    Allergies  Allergen Reactions  . Demerol [Meperidine]   . Penicillins    Past Medical History:  Diagnosis Date  . ASHD (arteriosclerotic heart disease)   . Hypertension   . Prediabetes   . Vitamin D deficiency    Health Maintenance  Topic Date Due  . INFLUENZA VACCINE  03/08/2018  . TETANUS/TDAP  02/28/2028  . PNA vac Low Risk Adult  Completed   Immunization History  Administered Date(s) Administered  . Influenza Split 06/03/2013  . Influenza, High Dose Seasonal PF 05/20/2014, 05/05/2015, 06/06/2016, 07/10/2017  . Pneumococcal Conjugate-13 03/02/2016  . Pneumococcal Polysaccharide-23 10/27/2014  . Td  08/09/2003, 02/27/2018  . Zoster 08/25/2010   Last Colon - Patient had Total Colectomy 1992 for severe Diverticular Disease  Past Surgical History:  Procedure Laterality Date  . CATARACT EXTRACTION W/ INTRAOCULAR LENS IMPLANT Right 1993  . CATARACT EXTRACTION W/ INTRAOCULAR LENS IMPLANT Left 2001  . CHOLECYSTECTOMY  2001  . COLECTOMY  1992   Family History  Problem Relation Age of Onset  . AAA (abdominal aortic aneurysm) Mother   . Heart disease Mother   . Hypertension Mother   . Heart disease Father   . Heart disease Sister   . Early death Brother        at birth   Social History    Socioeconomic History  . Marital status: Widowed    Spouse name: Not on file  . Number of children: 1 son & 1 daughter  Occupational History  . Retired Financial controller [employer]  Tobacco Use  . Smoking status: Former Smoker    Types: Cigars    Last attempt to quit: 06/23/2014    Years since quitting: 3.9  . Smokeless tobacco: Never Used  Substance and Sexual Activity  . Alcohol use: Yes    Alcohol/week: 7.0 standard drinks    Types: 7 Standard drinks or equivalent per week  . Drug use: No  . Sexual activity: No    ROS Constitutional: Denies fever, chills, weight loss/gain, headaches, insomnia,  night sweats or change in appetite. Does c/o fatigue. Eyes: Denies redness, blurred vision, diplopia, discharge, itchy or watery eyes.  ENT: Denies discharge, congestion, post nasal drip, epistaxis, sore throat, earache, hearing loss, dental pain, Tinnitus, Vertigo, Sinus pain or snoring.  Cardio: Denies chest pain, palpitations, irregular heartbeat, syncope, dyspnea, diaphoresis, orthopnea, PND, claudication or edema Respiratory: denies cough, dyspnea, DOE, pleurisy, hoarseness, laryngitis or wheezing.  Gastrointestinal: Denies dysphagia, heartburn, reflux, water brash, pain, cramps, nausea, vomiting, bloating, diarrhea, constipation, hematemesis, melena, hematochezia, jaundice or hemorrhoids Genitourinary: Denies dysuria, frequency, discharge, hematuria or flank pain. Has urgency, nocturia x 2-3 & occasional hesitancy. Musculoskeletal: Denies arthralgia, myalgia, stiffness, Jt. Swelling, pain, limp or strain/sprain. Denies Falls. Skin: Denies puritis, rash, hives, warts, acne, eczema or change in skin lesion Neuro: No weakness, tremor, incoordination, spasms, paresthesia or pain Psychiatric: Denies confusion, memory loss or sensory loss. Denies Depression. Endocrine: Denies change in weight, skin, hair change, nocturia, and paresthesia, diabetic polys, visual blurring or hyper / hypo glycemic  episodes.  Heme/Lymph: No excessive bleeding, bruising or enlarged lymph nodes.  Physical Exam  BP 132/66   Pulse 64   Temp (!) 97.3 F (36.3 C)   Resp 16   Ht 5\' 11"  (1.803 m)   Wt 182 lb 6.4 oz (82.7 kg)   BMI 25.44 kg/m   General Appearance: Well nourished and well groomed and in no apparent distress.  Eyes: PERRLA, EOMs, conjunctiva no swelling or erythema, normal fundi and vessels. Sinuses: No frontal/maxillary tenderness ENT/Mouth: EACs patent / TMs  nl. Nares clear without erythema, swelling, mucoid exudates. Oral hygiene is good. No erythema, swelling, or exudate. Tongue normal, non-obstructing. Tonsils not swollen or erythematous. Hearing normal.  Neck: Supple, thyroid not palpable. No bruits, nodes or JVD. Respiratory: Respiratory effort normal.  BS equal and clear bilateral without rales, rhonci, wheezing or stridor. Cardio: Heart sounds are normal with regular rate and rhythm and no murmurs, rubs or gallops. Peripheral pulses are normal and equal bilaterally without edema. No aortic or femoral bruits. Chest: symmetric with normal excursions and percussion.  Abdomen: Soft, with Nl bowel  sounds. Nontender, no guarding, rebound, hernias, masses, or organomegaly.  Lymphatics: Non tender without lymphadenopathy.  Genitourinary: No hernias.Testes nl. DRE - prostate nl for age - smooth & firm w/o nodules. Musculoskeletal: Full ROM all peripheral extremities, joint stability, 5/5 strength, and normal gait. Skin: Warm and dry without rashes, lesions, cyanosis, clubbing or  ecchymosis.  Neuro: Cranial nerves intact, reflexes equal bilaterally. Normal muscle tone, no cerebellar symptoms. Sensation intact.  Pysch: Alert and oriented X 3 with normal affect, insight and judgment appropriate.   Assessment and Plan  1. Annual Preventative/Screening Exam   2. Essential hypertension  - EKG 12-Lead - [place], RETROPERITNL ABD,  LTD - Urinalysis, Routine w reflex microscopic -  Microalbumin / creatinine urine ratio - CBC with Differential/Platelet - COMPLETE METABOLIC PANEL WITH GFR - Magnesium - TSH  3. Hyperlipidemia, mixed  - EKG 12-Lead - [place], RETROPERITNL ABD,  LTD - Lipid panel - TSH  4. Abnormal glucose  - EKG 12-Lead - [place], RETROPERITNL ABD,  LTD - Hemoglobin A1c - Insulin, random  5. Vitamin D deficiency  - VITAMIN D 25 Hydroxyl  6. ASHD (arteriosclerotic heart disease)  - EKG 12-Lead - [place], RETROPERITNL ABD,  LTD - Lipid panel  7. Paroxysmal atrial fibrillation (HCC)  - EKG 12-Lead - [place], RETROPERITNL ABD,  LTD - TSH  8. Prediabetes  - EKG 12-Lead - [place], RETROPERITNL ABD,  LTD - Hemoglobin A1c - Insulin, random  9. Gastroesophageal reflux disease  - CBC with Differential/Platelet - famotidine 40 MG; Take 1 tablet at bedtime for Acid Reflux - Disp: 90 tab; Rf: 3  10. Vascular dementia without behavioral disturbance ([place])   11. Screening for colorectal cancer  - POC Hemoccult Bld/Stl  12. BPH with obstruction/lower urinary tract symptoms  - PSA  13. History of CVA in adulthood   86. Prostate cancer screening  - PSA  15. Screening for ischemic heart disease  - EKG 12-Lead  16. FHx: heart disease  - EKG 12-Lead - [place], RETROPERITNL ABD,  LTD  17. Former smoker  - EKG 12-Lead - [place], RETROPERITNL ABD,  LTD  18. FHx: aortic aneurysm  - [place], RETROPERITNL ABD,  LTD  19. Screening for AAA (aortic abdominal aneurysm)  - [place], RETROPERITNL ABD,  LTD  20. Medication management  - Urinalysis, Routine w reflex microscopic - Microalbumin / creatinine urine ratio - CBC with Differential/Platelet - COMPLETE METABOLIC PANEL WITH GFR - Magnesium - Lipid panel - TSH - Hemoglobin A1c - Insulin, random - VITAMIN D 25 Hydroxyl      Patient was counseled in prudent diet, weight control to achieve/maintain BMI less than 25, BP monitoring, regular exercise and medications as discussed.  Discussed med effects and SE's.  Routine screening labs and tests as requested with regular follow-up as recommended. Over 40 minutes of exam, counseling, chart review and high complex critical decision making was performed

## 2018-06-07 NOTE — Patient Instructions (Addendum)
We Do NOT Approve of  [facility] [place] Soliciting Our Patients  To Do Home Visits  & We Do NOT Approve of [employer] > > > > > > > > > > > > > > > > > > > > > > > > > > > > > > > > > > >  > > > >  Bleeding Precautions When on Anticoagulant Therapy  WHAT IS ANTICOAGULANT THERAPY? Anticoagulant therapy is taking medicine to prevent or reduce blood clots. It is also called blood thinner therapy. Blood clots that form in your blood vessels can be dangerous. They can break loose and travel to your heart, lungs, or brain. This increases your risk of a heart attack or stroke. Anticoagulant therapy causes blood to clot more slowly. You may need anticoagulant therapy if you have:  A medical condition that increases the likelihood that blood clots will form.  A heart defect or a problem with heart rhythm. It is also a common treatment after heart surgery, such as valve replacement. WHAT ARE COMMON TYPES OF ANTICOAGULANT THERAPY? Anticoagulant medicine can be injected or taken by mouth.If you need anticoagulant therapy quickly at the hospital, the medicine may be injected under your skin or given through an IV tube. Heparin is a common example of an anticoagulant that you may get at the hospital. Most anticoagulant therapy is in the form of pills that you take at home every day. These may include:  Aspirin. This common blood thinner works by preventing blood cells (platelets) from sticking together to form a clot. Aspirin is not as strong as anticoagulants that slow down the time that it takes for your body to form a clot.  Clopidogrel. This is a newer type of drug that affects platelets. It is stronger than aspirin.  Warfarin. This is the most common anticoagulant. It changes the way your body uses vitamin K, a vitamin that helps your blood to clot. The risk of bleeding is higher with warfarin than with aspirin. You will need frequent blood tests to make sure you are taking  the safest amount.  New anticoagulants. Several new drugs have been approved. They are all taken by mouth. Studies show that these drugs work as well as warfarin. They do not require blood testing. They may cause less bleeding risk than warfarin. WHAT DO I NEED TO REMEMBER WHEN TAKING ANTICOAGULANT THERAPY? Anticoagulant therapy decreases your risk of forming a blood clot, but it increases your risk of bleeding. Work closely with your health care provider to make sure you are taking your medicine safely. These tips can help:  Learn ways to reduce your risk of bleeding.  If you are taking warfarin: ? Have blood tests as ordered by your health care provider. ? Do not make any sudden changes to your diet. Vitamin K in your diet can make warfarin less effective. ? Do not get pregnant. This medicine may cause birth defects.  Take your medicine at the same time every day. If you forget to take your medicine, take it as soon as you remember. If you miss a whole day, do not double your dose of medicine. Take your normal dose and call your health care provider to check in.  Do not stop taking your medicine on your own.  Tell your health care provider before you start taking any new medicine, vitamin, or herbal product. Some of these could interfere with your therapy.  Tell all of your health care providers that   you are on anticoagulant therapy.  Do not have surgery, medical procedures, or dental work until you tell your health care provider that you are on anticoagulant therapy. WHAT CAN AFFECT HOW ANTICOAGULANTS WORK? Certain foods, vitamins, medicines, supplements, and herbal medicines change the way that anticoagulant therapy works. They may increase or decrease the effects of your anticoagulant therapy. Either result can be dangerous for you.  Many over-the-counter medicines for pain, colds, or stomach problems interfere with anticoagulant therapy. Take these only as told by your health care  provider.  Do not drink alcohol. It can interfere with your medicine and increase your risk of an injury that causes bleeding.  If you are taking warfarin, do not begin eating more foods that contain vitamin K. These include leafy green vegetables. Ask your health care provider if you should avoid any foods. WHAT ARE SOME WAYS TO PREVENT BLEEDING? You can prevent bleeding by taking certain precautions:  Be extra careful when you use knives, scissors, or other sharp objects.  Use an electric razor instead of a blade.  Do not use toothpicks.  Use a soft toothbrush.  Wear shoes that have nonskid soles.  Use bath mats and handrails in your bathroom.  Wear gloves while you do yard work.  Wear a helmet when you ride a bike.  Wear your seat belt.  Prevent falls by removing loose rugs and extension cords from areas where you walk.  Do not play contact sports or participate in other activities that have a high risk of injury. WHEN SHOULD I CONTACT MY HEALTH CARE PROVIDER? Call your health care provider if:  You miss a dose of medicine: ? And you are not sure what to do. ? For more than one day.  You have: ? Menstrual bleeding that is heavier than normal. ? Blood in your urine. ? A bloody nose or bleeding gums. ? Easy bruising. ? Blood in your stool (feces) or have black and tarry stool. ? Side effects from your medicine.  You feel weak or dizzy.  You become pregnant. Seek immediate medical care if:  You have bleeding that will not stop.  You have sudden and severe headache or belly pain.  You vomit or you cough up bright red blood.  You have a severe blow to your head. WHAT ARE SOME QUESTIONS TO ASK MY HEALTH CARE PROVIDER?  What is the best anticoagulant therapy for my condition?  What side effects should I watch for?  When should I take my medicine? What should I do if I forget to take it?  Will I need to have regular blood tests?  Do I need to change my  diet? Are there foods or drinks that I should avoid?  What activities are safe for me?  What should I do if I want to get pregnant? This information is not intended to replace advice given to you by your health care provider. Make sure you discuss any questions you have with your health care provider. Document Released: 07/06/2015 Document Reviewed: 07/06/2015 Elsevier Interactive Patient Education  2017 [employer]     Preventive Care for Adults  A healthy lifestyle and preventive care can promote health and wellness. Preventive health guidelines for men include the following key practices:  A routine yearly physical is a good way to check with your health care provider about your health and preventative screening. It is a chance to share any concerns and updates on your health and to receive a thorough exam.    Visit your dentist for a routine exam and preventative care every 6 months. Brush your teeth twice a day and floss once a day. Good oral hygiene prevents tooth decay and gum disease.  The frequency of eye exams is based on your age, health, family medical history, use of contact lenses, and other factors. Follow your health care provider's recommendations for frequency of eye exams.  Eat a healthy diet. Foods such as vegetables, fruits, whole grains, low-fat dairy products, and lean protein foods contain the nutrients you need without too many calories. Decrease your intake of foods high in solid fats, added sugars, and salt. Eat the right amount of calories for you. Get information about a proper diet from your health care provider, if necessary.  Regular physical exercise is one of the most important things you can do for your health. Most adults should get at least 150 minutes of moderate-intensity exercise (any activity that increases your heart rate and causes you to sweat) each week. In addition, most adults need muscle-strengthening exercises on 2 or more days a  week.  Maintain a healthy weight. The body mass index (BMI) is a screening tool to identify possible weight problems. It provides an estimate of body fat based on height and weight. Your health care provider can find your BMI and can help you achieve or maintain a healthy weight. For adults 20 years and older:  A BMI below 18.5 is considered underweight.  A BMI of 18.5 to 24.9 is normal.  A BMI of 25 to 29.9 is considered overweight.  A BMI of 30 and above is considered obese.  Maintain normal blood lipids and cholesterol levels by exercising and minimizing your intake of saturated fat. Eat a balanced diet with plenty of fruit and vegetables. Blood tests for lipids and cholesterol should begin at age 20 and be repeated every 5 years. If your lipid or cholesterol levels are high, you are over 50, or you are at high risk for heart disease, you may need your cholesterol levels checked more frequently. Ongoing high lipid and cholesterol levels should be treated with medicines if diet and exercise are not working.  If you smoke, find out from your health care provider how to quit. If you do not use tobacco, do not start.  Lung cancer screening is recommended for adults aged 55-80 years who are at high risk for developing lung cancer because of a history of smoking. A yearly low-dose CT scan of the lungs is recommended for people who have at least a 30-pack-year history of smoking and are a current smoker or have quit within the past 15 years. A pack year of smoking is smoking an average of 1 pack of cigarettes a day for 1 year (for example: 1 pack a day for 30 years or 2 packs a day for 15 years). Yearly screening should continue until the smoker has stopped smoking for at least 15 years. Yearly screening should be stopped for people who develop a health problem that would prevent them from having lung cancer treatment.  If you choose to drink alcohol, do not have more than 2 drinks per day. One drink  is considered to be 12 ounces (355 mL) of beer, 5 ounces (148 mL) of wine, or 1.5 ounces (44 mL) of liquor.  Avoid use of street drugs. Do not share needles with anyone. Ask for help if you need support or instructions about stopping the use of drugs.  High blood pressure causes heart disease   and increases the risk of stroke. Your blood pressure should be checked at least every 1-2 years. Ongoing high blood pressure should be treated with medicines, if weight loss and exercise are not effective.  If you are 45-79 years old, ask your health care provider if you should take aspirin to prevent heart disease.  Diabetes screening involves taking a blood sample to check your fasting blood sugar level. Testing should be considered at a younger age or be carried out more frequently if you are overweight and have at least 1 risk factor for diabetes.  Colorectal cancer can be detected and often prevented. Most routine colorectal cancer screening begins at the age of 50 and continues through age 75. However, your health care provider may recommend screening at an earlier age if you have risk factors for colon cancer. On a yearly basis, your health care provider may provide home test kits to check for hidden blood in the stool. Use of a small camera at the end of a tube to directly examine the colon (sigmoidoscopy or colonoscopy) can detect the earliest forms of colorectal cancer. Talk to your health care provider about this at age 50, when routine screening begins. Direct exam of the colon should be repeated every 5-10 years through age 75, unless early forms of precancerous polyps or small growths are found.  Hepatitis C blood testing is recommended for all people born from 1945 through 1965 and any individual with known risks for hepatitis C.  Screening for abdominal aortic aneurysm (AAA)  by ultrasound is recommended for people who have history of high blood pressure or who are current or former  smokers.  Healthy men should  receive prostate-specific antigen (PSA) blood tests as part of routine cancer screening. Talk with your health care provider about prostate cancer screening.  Testicular cancer screening is  recommended for adult males. Screening includes self-exam, a health care provider exam, and other screening tests. Consult with your health care provider about any symptoms you have or any concerns you have about testicular cancer.  Use sunscreen. Apply sunscreen liberally and repeatedly throughout the day. You should seek shade when your shadow is shorter than you. Protect yourself by wearing long sleeves, pants, a wide-brimmed hat, and sunglasses year round, whenever you are outdoors.  Once a month, do a whole-body skin exam, using a mirror to look at the skin on your back. Tell your health care provider about new moles, moles that have irregular borders, moles that are larger than a pencil eraser, or moles that have changed in shape or color.  Stay current with required vaccines (immunizations).  Influenza vaccine. All adults should be immunized every year.  Tetanus, diphtheria, and acellular pertussis (Td, Tdap) vaccine. An adult who has not previously received Tdap or who does not know his vaccine status should receive 1 dose of Tdap. This initial dose should be followed by tetanus and diphtheria toxoids (Td) booster doses every 10 years. Adults with an unknown or incomplete history of completing a 3-dose immunization series with Td-containing vaccines should begin or complete a primary immunization series including a Tdap dose. Adults should receive a Td booster every 10 years.  Zoster vaccine. One dose is recommended for adults aged 60 years or older unless certain conditions are present.    PREVNAR - Pneumococcal 13-valent conjugate (PCV13) vaccine. When indicated, a person who is uncertain of his immunization history and has no record of immunization should receive the  PCV13 vaccine. An adult aged 19 years   or older who has certain medical conditions and has not been previously immunized should receive 1 dose of PCV13 vaccine. This PCV13 should be followed with a dose of pneumococcal polysaccharide (PPSV23) vaccine. The PPSV23 vaccine dose should be obtained 1 or more year(s)after the dose of PCV13 vaccine. An adult aged 19 years or older who has certain medical conditions and previously received 1 or more doses of PPSV23 vaccine should receive 1 dose of PCV13. The PCV13 vaccine dose should be obtained 1 or more years after the last PPSV23 vaccine dose.    PNEUMOVAX - Pneumococcal polysaccharide (PPSV23) vaccine. When PCV13 is also indicated, PCV13 should be obtained first. All adults aged 65 years and older should be immunized. An adult younger than age 65 years who has certain medical conditions should be immunized. Any person who resides in a nursing home or long-term care facility should be immunized. An adult smoker should be immunized. People with an immunocompromised condition and certain other conditions should receive both PCV13 and PPSV23 vaccines. People with human immunodeficiency virus (HIV) infection should be immunized as soon as possible after diagnosis. Immunization during chemotherapy or radiation therapy should be avoided. Routine use of PPSV23 vaccine is not recommended for American Indians, [place] Natives, or people younger than 65 years unless there are medical conditions that require PPSV23 vaccine. When indicated, people who have unknown immunization and have no record of immunization should receive PPSV23 vaccine. One-time revaccination 5 years after the first dose of PPSV23 is recommended for people aged 19-64 years who have chronic kidney failure, nephrotic syndrome, asplenia, or immunocompromised conditions. People who received 1-2 doses of PPSV23 before age 65 years should receive another dose of PPSV23 vaccine at age 65 years or later if at least 5  years have passed since the previous dose. Doses of PPSV23 are not needed for people immunized with PPSV23 at or after age 65 years.    Hepatitis A vaccine. Adults who wish to be protected from this disease, have certain high-risk conditions, work with hepatitis A-infected animals, work in hepatitis A research labs, or travel to or work in countries with a high rate of hepatitis A should be immunized. Adults who were previously unvaccinated and who anticipate close contact with an international adoptee during the first 60 days after arrival in the [place] from a country with a high rate of hepatitis A should be immunized.    Hepatitis B vaccine. Adults should be immunized if they wish to be protected from this disease, have certain high-risk conditions, may be exposed to blood or other infectious body fluids, are household contacts or sex partners of hepatitis B positive people, are clients or workers in certain care facilities, or travel to or work in countries with a high rate of hepatitis B.   Preventive Service / Frequency   Ages 65 and over  Blood pressure check.  Lipid and cholesterol check.  Lung cancer screening. / Every year if you are aged 55-80 years and have a 30-pack-year history of smoking and currently smoke or have quit within the past 15 years. Yearly screening is stopped once you have quit smoking for at least 15 years or develop a health problem that would prevent you from having lung cancer treatment.  Fecal occult blood test (FOBT) of stool. You may not have to do this test if you get a colonoscopy every 10 years.  Flexible sigmoidoscopy** or colonoscopy.** / Every 5 years for a flexible sigmoidoscopy or every 10 years for a   colonoscopy beginning at age 50 and continuing until age 75.  Hepatitis C blood test.** / For all people born from 1945 through 1965 and any individual with known risks for hepatitis C.  Abdominal aortic aneurysm (AAA) screening./ Screening  current or former smokers or have Hypertension.  Skin self-exam. / Monthly.  Influenza vaccine. / Every year.  Tetanus, diphtheria, and acellular pertussis (Tdap/Td) vaccine.** / 1 dose of Td every 10 years.   Zoster vaccine.** / 1 dose for adults aged 60 years or older.         Pneumococcal 13-valent conjugate (PCV13) vaccine.    Pneumococcal polysaccharide (PPSV23) vaccine.     Hepatitis A vaccine.** / Consult your health care provider.  Hepatitis B vaccine.** / Consult your health care provider. Screening for abdominal aortic aneurysm (AAA)  by ultrasound is recommended for people who have history of high blood pressure or who are current or former smokers. ++++++++++ Recommend Adult Low Dose Aspirin or  coated  Aspirin 81 mg daily  To reduce risk of Colon Cancer 20 %,  Skin Cancer 26 % ,  Malignant Melanoma 46%  and  Pancreatic cancer 60% ++++++++++++++++++++++ Vitamin D goal  is between 70-100.  Please make sure that you are taking your Vitamin D as directed.  It is very important as a natural anti-inflammatory  helping hair, skin, and nails, as well as reducing stroke and heart attack risk.  It helps your bones and helps with mood. It also decreases numerous cancer risks so please take it as directed.  Low Vit D is associated with a 200-300% higher risk for CANCER  and 200-300% higher risk for HEART   ATTACK  &  STROKE.   ...................................... It is also associated with higher death rate at younger ages,  autoimmune diseases like Rheumatoid arthritis, Lupus, Multiple Sclerosis.    Also many other serious conditions, like depression, Alzheimer's Dementia, infertility, muscle aches, fatigue, fibromyalgia - just to name a few. ++++++++++++++++++++++ Recommend the book "The END of DIETING" by Dr Joel Fuhrman  & the book "The END of DIABETES " by Dr Joel Fuhrman At [employer] - get book & Audio CD's    Being diabetic has a  300% increased risk for  heart attack, stroke, cancer, and alzheimer- type vascular dementia. It is very important that you work harder with diet by avoiding all foods that are white. Avoid white rice (brown & wild rice is OK), white potatoes (sweetpotatoes in moderation is OK), White bread or wheat bread or anything made out of white flour like bagels, donuts, rolls, buns, biscuits, cakes, pastries, cookies, pizza crust, and pasta (made from white flour & egg whites) - vegetarian pasta or spinach or wheat pasta is OK. Multigrain breads like Arnold's or [employer], or multigrain sandwich thins or flatbreads.  Diet, exercise and weight loss can reverse and cure diabetes in the early stages.  Diet, exercise and weight loss is very important in the control and prevention of complications of diabetes which affects every system in your body, ie. Brain - dementia/stroke, eyes - glaucoma/blindness, heart - heart attack/heart failure, kidneys - dialysis, stomach - gastric paralysis, intestines - malabsorption, nerves - severe painful neuritis, circulation - gangrene & loss of a leg(s), and finally cancer and Alzheimers.    I recommend avoid fried & greasy foods,  sweets/candy, white rice (brown or wild rice or Quinoa is OK), white potatoes (sweet potatoes are OK) - anything made from white flour - bagels, doughnuts, rolls, buns, biscuits,white   and wheat breads, pizza crust and traditional pasta made of white flour & egg white(vegetarian pasta or spinach or wheat pasta is OK).  Multi-grain bread is OK - like multi-grain flat bread or sandwich thins. Avoid alcohol in excess. Exercise is also important.    Eat all the vegetables you want - avoid meat, especially red meat and dairy - especially cheese.  Cheese is the most concentrated form of trans-fats which is the worst thing to clog up our arteries. Veggie cheese is OK which can be found in the fresh produce section at [employer] or [employer] or  [employer]  ++++++++++++++++++++++ DASH Eating Plan  DASH stands for "Dietary Approaches to Stop Hypertension."   The DASH eating plan is a healthy eating plan that has been shown to reduce high blood pressure (hypertension). Additional health benefits may include reducing the risk of type 2 diabetes mellitus, heart disease, and stroke. The DASH eating plan may also help with weight loss. WHAT DO I NEED TO KNOW ABOUT THE DASH EATING PLAN? For the DASH eating plan, you will follow these general guidelines:  Choose foods with a percent daily value for sodium of less than 5% (as listed on the food label).  Use salt-free seasonings or herbs instead of table salt or sea salt.  Check with your health care provider or pharmacist before using salt substitutes.  Eat lower-sodium products, often labeled as "lower sodium" or "no salt added."  Eat fresh foods.  Eat more vegetables, fruits, and low-fat dairy products.  Choose whole grains. Look for the word "whole" as the first word in the ingredient list.  Choose fish   Limit sweets, desserts, sugars, and sugary drinks.  Choose heart-healthy fats.  Eat veggie cheese   Eat more home-cooked food and less restaurant, buffet, and fast food.  Limit fried foods.  Cook foods using methods other than frying.  Limit canned vegetables. If you do use them, rinse them well to decrease the sodium.  When eating at a restaurant, ask that your food be prepared with less salt, or no salt if possible.                      WHAT FOODS CAN I EAT? Read Dr Joel Fuhrman's books on The End of Dieting & The End of Diabetes  Grains Whole grain or whole wheat bread. Brown rice. Whole grain or whole wheat pasta. Quinoa, bulgur, and whole grain cereals. Low-sodium cereals. Corn or whole wheat flour tortillas. Whole grain cornbread. Whole grain crackers. Low-sodium crackers.  Vegetables Fresh or frozen vegetables (raw, steamed, roasted, or grilled). Low-sodium  or reduced-sodium tomato and vegetable juices. Low-sodium or reduced-sodium tomato sauce and paste. Low-sodium or reduced-sodium canned vegetables.   Fruits All fresh, canned (in natural juice), or frozen fruits.  Protein Products  All fish and seafood.  Dried beans, peas, or lentils. Unsalted nuts and seeds. Unsalted canned beans.  Dairy Low-fat dairy products, such as skim or 1% milk, 2% or reduced-fat cheeses, low-fat ricotta or cottage cheese, or plain low-fat yogurt. Low-sodium or reduced-sodium cheeses.  Fats and Oils Tub margarines without trans fats. Light or reduced-fat mayonnaise and salad dressings (reduced sodium). Avocado. Safflower, olive, or canola oils. Natural peanut or almond butter.  Other Unsalted popcorn and pretzels. The items listed above may not be a complete list of recommended foods or beverages. Contact your dietitian for more options.  ++++++++++++++++++++  WHAT FOODS ARE NOT RECOMMENDED? Grains/ White flour or wheat flour White   bread. White pasta. White rice. Refined cornbread. Bagels and croissants. Crackers that contain trans fat.  Vegetables  Creamed or fried vegetables. Vegetables in a . Regular canned vegetables. Regular canned tomato sauce and paste. Regular tomato and vegetable juices.  Fruits Dried fruits. Canned fruit in light or heavy syrup. Fruit juice.  Meat and Other Protein Products Meat in general - RED meat & White meat.  Fatty cuts of meat. Ribs, chicken wings, all processed meats as bacon, sausage, bologna, salami, fatback, hot dogs, bratwurst and packaged luncheon meats.  Dairy Whole or 2% milk, cream, half-and-half, and cream cheese. Whole-fat or sweetened yogurt. Full-fat cheeses or blue cheese. Non-dairy creamers and whipped toppings. Processed cheese, cheese spreads, or cheese curds.  Condiments Onion and garlic salt, seasoned salt, table salt, and sea salt. Canned and packaged gravies. Worcestershire sauce. Tartar sauce.  Barbecue sauce. Teriyaki sauce. Soy sauce, including reduced sodium. Steak sauce. Fish sauce. Oyster sauce. Cocktail sauce. Horseradish. Ketchup and mustard. Meat flavorings and tenderizers. Bouillon cubes. Hot sauce. Tabasco sauce. Marinades. Taco seasonings. Relishes.  Fats and Oils Butter, stick margarine, lard, shortening and bacon fat. Coconut, palm kernel, or palm oils. Regular salad dressings.  Pickles and olives. Salted popcorn and pretzels.  The items listed above may not be a complete list of foods and beverages to avoid.    

## 2018-06-08 ENCOUNTER — Other Ambulatory Visit: Payer: Self-pay | Admitting: Physician Assistant

## 2018-06-08 ENCOUNTER — Encounter: Payer: Self-pay | Admitting: Internal Medicine

## 2018-06-08 ENCOUNTER — Ambulatory Visit (INDEPENDENT_AMBULATORY_CARE_PROVIDER_SITE_OTHER): Payer: Medicare Other | Admitting: Internal Medicine

## 2018-06-08 VITALS — BP 132/66 | HR 64 | Temp 97.3°F | Resp 16 | Ht 71.0 in | Wt 182.4 lb

## 2018-06-08 DIAGNOSIS — K219 Gastro-esophageal reflux disease without esophagitis: Secondary | ICD-10-CM

## 2018-06-08 DIAGNOSIS — E559 Vitamin D deficiency, unspecified: Secondary | ICD-10-CM

## 2018-06-08 DIAGNOSIS — Z8249 Family history of ischemic heart disease and other diseases of the circulatory system: Secondary | ICD-10-CM

## 2018-06-08 DIAGNOSIS — Z79899 Other long term (current) drug therapy: Secondary | ICD-10-CM

## 2018-06-08 DIAGNOSIS — Z1211 Encounter for screening for malignant neoplasm of colon: Secondary | ICD-10-CM

## 2018-06-08 DIAGNOSIS — N138 Other obstructive and reflux uropathy: Secondary | ICD-10-CM | POA: Diagnosis not present

## 2018-06-08 DIAGNOSIS — Z8673 Personal history of transient ischemic attack (TIA), and cerebral infarction without residual deficits: Secondary | ICD-10-CM

## 2018-06-08 DIAGNOSIS — I10 Essential (primary) hypertension: Secondary | ICD-10-CM | POA: Diagnosis not present

## 2018-06-08 DIAGNOSIS — R7309 Other abnormal glucose: Secondary | ICD-10-CM | POA: Diagnosis not present

## 2018-06-08 DIAGNOSIS — N401 Enlarged prostate with lower urinary tract symptoms: Secondary | ICD-10-CM | POA: Diagnosis not present

## 2018-06-08 DIAGNOSIS — I480 Paroxysmal atrial fibrillation: Secondary | ICD-10-CM | POA: Diagnosis not present

## 2018-06-08 DIAGNOSIS — E782 Mixed hyperlipidemia: Secondary | ICD-10-CM

## 2018-06-08 DIAGNOSIS — R7303 Prediabetes: Secondary | ICD-10-CM

## 2018-06-08 DIAGNOSIS — Z0001 Encounter for general adult medical examination with abnormal findings: Secondary | ICD-10-CM

## 2018-06-08 DIAGNOSIS — Z136 Encounter for screening for cardiovascular disorders: Secondary | ICD-10-CM | POA: Diagnosis not present

## 2018-06-08 DIAGNOSIS — F0150 Vascular dementia without behavioral disturbance: Secondary | ICD-10-CM

## 2018-06-08 DIAGNOSIS — I2510 Atherosclerotic heart disease of native coronary artery without angina pectoris: Secondary | ICD-10-CM | POA: Diagnosis not present

## 2018-06-08 DIAGNOSIS — Z0000 Encounter for general adult medical examination without abnormal findings: Secondary | ICD-10-CM | POA: Diagnosis not present

## 2018-06-08 DIAGNOSIS — Z87891 Personal history of nicotine dependence: Secondary | ICD-10-CM

## 2018-06-08 DIAGNOSIS — Z125 Encounter for screening for malignant neoplasm of prostate: Secondary | ICD-10-CM | POA: Diagnosis not present

## 2018-06-08 DIAGNOSIS — Z1212 Encounter for screening for malignant neoplasm of rectum: Secondary | ICD-10-CM

## 2018-06-08 MED ORDER — FAMOTIDINE 40 MG PO TABS: ORAL_TABLET | ORAL | 3 refills | Status: DC

## 2018-06-09 LAB — URINALYSIS, ROUTINE W REFLEX MICROSCOPIC
BILIRUBIN URINE: NEGATIVE
Bacteria, UA: NONE SEEN /HPF
Glucose, UA: NEGATIVE
Hgb urine dipstick: NEGATIVE
Hyaline Cast: NONE SEEN /LPF
KETONES UR: NEGATIVE
NITRITE: NEGATIVE
PROTEIN: NEGATIVE
RBC / HPF: NONE SEEN /HPF (ref 0–2)
SQUAMOUS EPITHELIAL / LPF: NONE SEEN /HPF (ref <=5)
Specific Gravity, Urine: 1.015 (ref 1.001–1.03)
WBC, UA: NONE SEEN /HPF (ref 0–5)
pH: 6 (ref 5.0–8.0)

## 2018-06-09 LAB — MICROALBUMIN / CREATININE URINE RATIO
Creatinine, Urine: 134 mg/dL (ref 20–320)
MICROALB UR: 0.7 mg/dL
MICROALB/CREAT RATIO: 5 ug/mg{creat} (ref <30)

## 2018-06-11 ENCOUNTER — Other Ambulatory Visit: Payer: Self-pay | Admitting: Internal Medicine

## 2018-06-11 LAB — CBC WITH DIFFERENTIAL/PLATELET
BASOS ABS: 31 {cells}/uL (ref 0–200)
Basophils Relative: 0.6 %
Eosinophils Absolute: 148 cells/uL (ref 15–500)
Eosinophils Relative: 2.9 %
HCT: 38.8 % (ref 38.5–50.0)
Hemoglobin: 13.1 g/dL — ABNORMAL LOW (ref 13.2–17.1)
Lymphs Abs: 1397 cells/uL (ref 850–3900)
MCH: 31.9 pg (ref 27.0–33.0)
MCHC: 33.8 g/dL (ref 32.0–36.0)
MCV: 94.4 fL (ref 80.0–100.0)
MONOS PCT: 11.1 %
MPV: 10.4 fL (ref 7.5–12.5)
NEUTROS PCT: 58 %
Neutro Abs: 2958 cells/uL (ref 1500–7800)
Platelets: 239 10*3/uL (ref 140–400)
RBC: 4.11 10*6/uL — ABNORMAL LOW (ref 4.20–5.80)
RDW: 11.6 % (ref 11.0–15.0)
TOTAL LYMPHOCYTE: 27.4 %
WBC mixed population: 566 cells/uL (ref 200–950)
WBC: 5.1 10*3/uL (ref 3.8–10.8)

## 2018-06-11 LAB — COMPLETE METABOLIC PANEL WITH GFR
AG RATIO: 1.6 (calc) (ref 1.0–2.5)
ALBUMIN MSPROF: 4.1 g/dL (ref 3.6–5.1)
ALT: 9 U/L (ref 9–46)
AST: 14 U/L (ref 10–35)
Alkaline phosphatase (APISO): 67 U/L (ref 40–115)
BUN / CREAT RATIO: 14 (calc) (ref 6–22)
BUN: 16 mg/dL (ref 7–25)
CO2: 29 mmol/L (ref 20–32)
CREATININE: 1.18 mg/dL — AB (ref 0.70–1.11)
Calcium: 9.5 mg/dL (ref 8.6–10.3)
Chloride: 107 mmol/L (ref 98–110)
GFR, Est African American: 61 mL/min/{1.73_m2} (ref >=60)
GFR, Est Non African American: 53 mL/min/{1.73_m2} — ABNORMAL LOW (ref >=60)
GLOBULIN: 2.5 g/dL (ref 1.9–3.7)
GLUCOSE: 116 mg/dL — AB (ref 65–99)
POTASSIUM: 4.6 mmol/L (ref 3.5–5.3)
SODIUM: 144 mmol/L (ref 135–146)
Total Bilirubin: 0.6 mg/dL (ref 0.2–1.2)
Total Protein: 6.6 g/dL (ref 6.1–8.1)

## 2018-06-11 LAB — LIPID PANEL
CHOLESTEROL: 153 mg/dL (ref <200)
HDL: 53 mg/dL (ref >40)
LDL CHOLESTEROL (CALC): 81 mg/dL
Non-HDL Cholesterol (Calc): 100 mg/dL (calc) (ref <130)
Total CHOL/HDL Ratio: 2.9 (calc) (ref <5.0)
Triglycerides: 95 mg/dL (ref <150)

## 2018-06-11 LAB — TSH: TSH: 2.49 m[IU]/L (ref 0.40–4.50)

## 2018-06-11 LAB — INSULIN, RANDOM: Insulin: 17.9 u[IU]/mL (ref 2.0–19.6)

## 2018-06-11 LAB — HEMOGLOBIN A1C
EAG (MMOL/L): 5.5 (calc)
Hgb A1c MFr Bld: 5.1 % of total Hgb (ref <5.7)
Mean Plasma Glucose: 100 (calc)

## 2018-06-11 LAB — PSA: PSA: 2.6 ng/mL (ref <=4.0)

## 2018-06-11 LAB — VITAMIN D 25 HYDROXY (VIT D DEFICIENCY, FRACTURES): Vit D, 25-Hydroxy: 54 ng/mL (ref 30–100)

## 2018-06-11 LAB — MAGNESIUM: Magnesium: 2.1 mg/dL (ref 1.5–2.5)

## 2018-06-25 ENCOUNTER — Other Ambulatory Visit: Payer: Self-pay | Admitting: Adult Health

## 2018-06-26 ENCOUNTER — Ambulatory Visit (INDEPENDENT_AMBULATORY_CARE_PROVIDER_SITE_OTHER): Payer: Medicare Other

## 2018-06-26 VITALS — Temp 97.4°F

## 2018-06-26 DIAGNOSIS — Z23 Encounter for immunization: Secondary | ICD-10-CM

## 2018-07-25 ENCOUNTER — Ambulatory Visit (INDEPENDENT_AMBULATORY_CARE_PROVIDER_SITE_OTHER): Payer: Medicare Other | Admitting: Podiatry

## 2018-07-25 ENCOUNTER — Encounter: Payer: Self-pay | Admitting: Podiatry

## 2018-07-25 DIAGNOSIS — B351 Tinea unguium: Secondary | ICD-10-CM

## 2018-07-25 DIAGNOSIS — M79674 Pain in right toe(s): Secondary | ICD-10-CM | POA: Diagnosis not present

## 2018-07-25 DIAGNOSIS — I639 Cerebral infarction, unspecified: Secondary | ICD-10-CM

## 2018-07-25 DIAGNOSIS — M79675 Pain in left toe(s): Secondary | ICD-10-CM

## 2018-07-25 HISTORY — DX: Cerebral infarction, unspecified: I63.9

## 2018-07-25 NOTE — Progress Notes (Signed)
Subjective: Joseph Cherry presents today with painful, thick toenails 1-5 b/l that he cannot cut and which interfere with daily activities.  Pain is aggravated when wearing enclosed shoe gear.  He is accompanied by his son and daughter-in-law today.  They voice no new problems on today's visit.  Unk Pinto, MD is his PCP and last DOS was 06/08/2018.  He remains on Eliquis.   Current Outpatient Medications:  .  bisoprolol (ZEBETA) 5 MG tablet, TAKE 1 TABLET ONCE DAILY., Disp: 90 tablet, Rfl: 0 .  Cholecalciferol (VITAMIN D) 2000 units CAPS, Take 1 capsule by mouth daily., Disp: , Rfl:  .  ELIQUIS 5 MG TABS tablet, TAKE 1 TABLET BY MOUTH TWICE DAILY., Disp: 60 tablet, Rfl: 2 .  famotidine (PEPCID) 40 MG tablet, Take 1 tablet at bedtime for Acid Reflux, Disp: 90 tablet, Rfl: 3 .  finasteride (PROSCAR) 5 MG tablet, Take 1 tablet (5 mg total) by mouth daily. In AM, Disp: 90 tablet, Rfl: 3 .  losartan (COZAAR) 100 MG tablet, TAKE 1/2 TO 1 TABLET DAILY FOR BLOOD PRESSURE., Disp: 90 tablet, Rfl: 0 .  montelukast (SINGULAIR) 10 MG tablet, TAKE 1 TABLET ONCE A DAY FOR ALLERGIES., Disp: 30 tablet, Rfl: PRN .  Multiple Vitamin (MULTIVITAMIN) tablet, Take 1 tablet by mouth daily., Disp: , Rfl:  .  verapamil (CALAN-SR) 240 MG CR tablet, TAKE 1 TABLET TWICE DAILY AFTER MEALS FOR HEART RHYTHM., Disp: 180 tablet, Rfl: 0  Allergies  Allergen Reactions  . Demerol [Meperidine]   . Penicillins     Objective:  Vascular Examination: Capillary refill time <3 seconds x 10 digits Dorsalis pedis and Posterior tibial pulses are both faintly palpable b/l Digital hair absent x 10 digits Skin temperature gradient WNL b/l  Dermatological Examination: Skin with normal turgor, texture and tone b/l  Toenails 1-5 b/l discolored, thick, dystrophic with subungual debris and pain with palpation to nailbeds due to thickness of nails.  No open wounds.  No interdigital macerations.  Musculoskeletal: Muscle  strength 5/5 to all LE muscle groups  Mildly reducible hammertoes digits 2-5 b/l  Neurological: Sensation intact with 10 gram monofilament.  Vibratory sensation intact.  Assessment: Painful onychomycosis toenails 1-5 b/l   Plan: 1. Examination today. No new changes. No new orders. 2. Toenails 1-5 b/l were debrided in length and girth without iatrogenic bleeding. 3. Patient to continue soft, supportive shoe gear 4. Patient to report any pedal injuries to medical professional immediately. 5. Follow up 3 months. Patient/POA to call should there be a concern in the interim.

## 2018-07-25 NOTE — Patient Instructions (Signed)

## 2018-08-22 ENCOUNTER — Other Ambulatory Visit: Payer: Self-pay | Admitting: Internal Medicine

## 2018-09-10 ENCOUNTER — Other Ambulatory Visit: Payer: Self-pay | Admitting: Internal Medicine

## 2018-09-10 ENCOUNTER — Other Ambulatory Visit: Payer: Self-pay | Admitting: Adult Health

## 2018-09-18 NOTE — Progress Notes (Signed)
Patient ID: Joseph Cherry, male   DOB: 1922/07/12, 85 y.o.   MRN: [id number]  Assessment and Plan:  Hypertension:  -Continue medication,  -monitor blood pressure at home.  -Continue DASH diet.   -Reminder to go to the ER if any CP, SOB, nausea, dizziness, severe HA, changes vision/speech, left arm numbness and tingling, and jaw pain.  Cholesterol: -Continue diet and exercise.    Pre-diabetes: -Continue diet and exercise.   Vitamin D Def: -continue medications.   Memory Loss -stable per daughter.  Check B12- has not been checked, likely due to vascular dementia  Continue diet and meds as discussed. Further disposition pending results of labs. Future Appointments  Date Time Provider [place]  10/24/2018 10:30 AM Marzetta Board, DPM [facility]  12/24/2018 11:00 AM Unk Pinto, MD [facility] None  06/27/2019 10:00 AM Unk Pinto, MD [facility] None    HPI 85 y.o. male  presents for 3 month follow up with hypertension, hyperlipidemia, prediabetes and vitamin D.  Left ear cerumen impaction.    His blood pressure has been controlled at home, today their BP is BP: 114/68.   He does workout. He denies chest pain, shortness of breath, dizziness.  He has a history of CVA 2017 and again Aug 2019, Left CVA received TPA, has Afib, on eliquis and follows with D.r Caryl Comes. Has some short term memory issues, daughter is with him, states it is stable.    He is on cholesterol medication and denies myalgias. His cholesterol is at goal. The cholesterol last visit was:   Lab Results  Component Value Date   CHOL 153 06/08/2018   HDL 53 06/08/2018   LDLCALC 81 06/08/2018   TRIG 95 06/08/2018   CHOLHDL 2.9 06/08/2018     He has been working on diet and exercise for prediabetes, and denies foot ulcerations, hyperglycemia, hypoglycemia , increased appetite, nausea, paresthesia of the feet, polydipsia, polyuria, visual disturbances, vomiting and weight loss. Last A1C  in the office was:  Lab Results  Component Value Date   HGBA1C 5.1 06/08/2018    Patient is on Vitamin D supplement.  Lab Results  Component Value Date   VD25OH 54 06/08/2018       Current Medications:  Current Outpatient Medications on File Prior to Visit  Medication Sig Dispense Refill  . bisoprolol (ZEBETA) 5 MG tablet TAKE 1 TABLET ONCE DAILY. 90 tablet 0  . Cholecalciferol (VITAMIN D) 2000 units CAPS Take 1 capsule by mouth daily.    Marland Kitchen ELIQUIS 5 MG TABS tablet TAKE 1 TABLET BY MOUTH TWICE DAILY. 60 tablet 0  . famotidine (PEPCID) 40 MG tablet Take 1 tablet at bedtime for Acid Reflux 90 tablet 3  . finasteride (PROSCAR) 5 MG tablet Take 1 tablet (5 mg total) by mouth daily. In AM 90 tablet 3  . losartan (COZAAR) 100 MG tablet TAKE 1/2 TO 1 TABLET DAILY FOR BLOOD PRESSURE. 90 tablet 1  . montelukast (SINGULAIR) 10 MG tablet TAKE 1 TABLET ONCE A DAY FOR ALLERGIES. 30 tablet PRN  . Multiple Vitamin (MULTIVITAMIN) tablet Take 1 tablet by mouth daily.    . verapamil (CALAN-SR) 240 MG CR tablet TAKE 1 TABLET TWICE DAILY AFTER MEALS FOR HEART RHYTHM. 180 tablet 0   No current facility-administered medications on file prior to visit.     Medical History:  Past Medical History:  Diagnosis Date  . ASHD (arteriosclerotic heart disease)   . Hypertension   . Prediabetes   . Vitamin D deficiency  Allergies:  Allergies  Allergen Reactions  . Demerol [Meperidine]   . Penicillins      Review of Systems:  Review of Systems  Constitutional: Negative for chills, fever and malaise/fatigue.  HENT: Negative for congestion, ear pain and sore throat.   Eyes: Negative.   Respiratory: Negative for cough, shortness of breath and wheezing.   Cardiovascular: Negative for chest pain, palpitations and leg swelling.  Gastrointestinal: Negative for blood in stool, constipation, diarrhea and melena.  Genitourinary: Negative.   Skin: Negative.   Neurological: Negative for dizziness, sensory  change, loss of consciousness and headaches.  Psychiatric/Behavioral: Negative.  Negative for depression. The patient is not nervous/anxious and does not have insomnia.     Family history- Review and unchanged  Social history- Review and unchanged  Physical Exam: BP 114/68   Pulse (!) 55   Temp 98.2 F (36.8 C)   Ht 5\' 11"  (1.803 m)   Wt 187 lb (84.8 kg)   SpO2 98%   BMI 26.08 kg/m  Wt Readings from Last 3 Encounters:  09/19/18 187 lb (84.8 kg)  06/08/18 182 lb 6.4 oz (82.7 kg)  04/18/18 184 lb 9.6 oz (83.7 kg)    General Appearance: Well nourished well developed, in no apparent distress. Eyes: PERRLA, EOMs, conjunctiva no swelling or erythema ENT/Mouth: Ear canals normal without obstruction, swelling, erythma, discharge.  TMs normal bilaterally.  Oropharynx moist, clear, without exudate, or postoropharyngeal swelling. Neck: Supple, thyroid normal,no cervical adenopathy  Respiratory: Respiratory effort normal, Breath sounds clear A&P without rhonchi, wheeze, or rale.  No retractions, no accessory usage. Cardio: RRR with no MRGs. Brisk peripheral pulses without edema.  Abdomen: Soft, + BS,  Non tender, no guarding, rebound, hernias, masses. Musculoskeletal: Full ROM, 5/5 strength, Normal gait Skin: Warm, dry without rashes, lesions, ecchymosis.  Neuro: Awake and oriented X 3, Cranial nerves intact. Normal muscle tone, no cerebellar symptoms. Psych: Normal affect, some repetition of stories, poor short term medication. Insight and Judgment appropriate.    Vicie Mutters, PA-C 11:37 AM [facility]

## 2018-09-19 ENCOUNTER — Ambulatory Visit (INDEPENDENT_AMBULATORY_CARE_PROVIDER_SITE_OTHER): Payer: Medicare Other | Admitting: Physician Assistant

## 2018-09-19 ENCOUNTER — Encounter: Payer: Self-pay | Admitting: Physician Assistant

## 2018-09-19 VITALS — BP 114/68 | HR 55 | Temp 98.2°F | Ht 71.0 in | Wt 187.0 lb

## 2018-09-19 DIAGNOSIS — F0151 Vascular dementia with behavioral disturbance: Secondary | ICD-10-CM | POA: Diagnosis not present

## 2018-09-19 DIAGNOSIS — E538 Deficiency of other specified B group vitamins: Secondary | ICD-10-CM | POA: Diagnosis not present

## 2018-09-19 DIAGNOSIS — Z130 Encounter for screening for diseases of the blood and blood-forming organs and certain disorders involving the immune mechanism: Secondary | ICD-10-CM | POA: Diagnosis not present

## 2018-09-19 DIAGNOSIS — I10 Essential (primary) hypertension: Secondary | ICD-10-CM | POA: Diagnosis not present

## 2018-09-19 DIAGNOSIS — R5383 Other fatigue: Secondary | ICD-10-CM | POA: Diagnosis not present

## 2018-09-19 DIAGNOSIS — F329 Major depressive disorder, single episode, unspecified: Secondary | ICD-10-CM

## 2018-09-19 DIAGNOSIS — I480 Paroxysmal atrial fibrillation: Secondary | ICD-10-CM

## 2018-09-19 DIAGNOSIS — E782 Mixed hyperlipidemia: Secondary | ICD-10-CM

## 2018-09-19 DIAGNOSIS — F0153 Vascular dementia, unspecified severity, with mood disturbance: Secondary | ICD-10-CM

## 2018-09-19 NOTE — Patient Instructions (Signed)
Use a dropper or use a cap to put peroxide, olive oil,mineral oil or canola oil in the effected ear- 2-3 times a week. Let it soak for 20-30 min then you can take a shower or use a baby bulb with warm water to wash out the ear wax.  Do not use Qtips  Can get the debrox over the counter kit and clean out his left ear   Memory Compensation Strategies  1. Use "WARM" strategy.  W= write it down  A= associate it  R= repeat it  M= make a mental note  2.   You can keep a Social worker.  Use a 3-ring notebook with sections for the following: calendar, important names and phone numbers,  medications, doctors' names/phone numbers, lists/reminders, and a section to journal what you did  each day.   3.    Use a calendar to write appointments down.  4.    Write yourself a schedule for the day.  This can be placed on the calendar or in a separate section of the Memory Notebook.  Keeping a  regular schedule can help memory.  5.    Use medication organizer with sections for each day or morning/evening pills.  You may need help loading it  6.    Keep a basket, or pegboard by the door.  Place items that you need to take out with you in the basket or on the pegboard.  You may also want to  include a message board for reminders.  7.    Use sticky notes.  Place sticky notes with reminders in a place where the task is performed.  For example: " turn off the  stove" placed by the stove, "lock the door" placed on the door at eye level, " take your medications" on  the bathroom mirror or by the place where you normally take your medications.  8.    Use alarms/timers.  Use while cooking to remind yourself to check on food or as a reminder to take your medicine, or as a  reminder to make a call, or as a reminder to perform another task, etc.

## 2018-09-20 LAB — COMPLETE METABOLIC PANEL WITH GFR
AG RATIO: 1.3 (calc) (ref 1.0–2.5)
ALT: 9 U/L (ref 9–46)
AST: 13 U/L (ref 10–35)
Albumin: 3.6 g/dL (ref 3.6–5.1)
Alkaline phosphatase (APISO): 63 U/L (ref 35–144)
BUN/Creatinine Ratio: 14 (calc) (ref 6–22)
BUN: 18 mg/dL (ref 7–25)
CALCIUM: 9.3 mg/dL (ref 8.6–10.3)
CO2: 28 mmol/L (ref 20–32)
Chloride: 107 mmol/L (ref 98–110)
Creat: 1.29 mg/dL — ABNORMAL HIGH (ref 0.70–1.11)
GFR, EST NON AFRICAN AMERICAN: 47 mL/min/{1.73_m2} — AB (ref >=60)
GFR, Est African American: 55 mL/min/{1.73_m2} — ABNORMAL LOW (ref >=60)
Globulin: 2.7 g/dL (calc) (ref 1.9–3.7)
Glucose, Bld: 88 mg/dL (ref 65–99)
POTASSIUM: 4.6 mmol/L (ref 3.5–5.3)
Sodium: 141 mmol/L (ref 135–146)
Total Bilirubin: 0.5 mg/dL (ref 0.2–1.2)
Total Protein: 6.3 g/dL (ref 6.1–8.1)

## 2018-09-20 LAB — CBC WITH DIFFERENTIAL/PLATELET
Absolute Monocytes: 929 cells/uL (ref 200–950)
Basophils Absolute: 29 cells/uL (ref 0–200)
Basophils Relative: 0.4 %
Eosinophils Absolute: 122 cells/uL (ref 15–500)
Eosinophils Relative: 1.7 %
HCT: 36.7 % — ABNORMAL LOW (ref 38.5–50.0)
Hemoglobin: 12.5 g/dL — ABNORMAL LOW (ref 13.2–17.1)
Lymphs Abs: 1296 cells/uL (ref 850–3900)
MCH: 32.2 pg (ref 27.0–33.0)
MCHC: 34.1 g/dL (ref 32.0–36.0)
MCV: 94.6 fL (ref 80.0–100.0)
MPV: 10.5 fL (ref 7.5–12.5)
Monocytes Relative: 12.9 %
Neutro Abs: 4824 cells/uL (ref 1500–7800)
Neutrophils Relative %: 67 %
PLATELETS: 250 10*3/uL (ref 140–400)
RBC: 3.88 10*6/uL — ABNORMAL LOW (ref 4.20–5.80)
RDW: 11.8 % (ref 11.0–15.0)
TOTAL LYMPHOCYTE: 18 %
WBC: 7.2 10*3/uL (ref 3.8–10.8)

## 2018-09-20 LAB — LIPID PANEL
Cholesterol: 137 mg/dL (ref <200)
HDL: 44 mg/dL (ref >=40)
LDL Cholesterol (Calc): 72 mg/dL (calc)
Non-HDL Cholesterol (Calc): 93 mg/dL (calc) (ref <130)
Total CHOL/HDL Ratio: 3.1 (calc) (ref <5.0)
Triglycerides: 128 mg/dL (ref <150)

## 2018-09-20 LAB — IRON, TOTAL/TOTAL IRON BINDING CAP
%SAT: 28 % (calc) (ref 20–48)
Iron: 87 ug/dL (ref 50–180)
TIBC: 315 mcg/dL (calc) (ref 250–425)

## 2018-09-20 LAB — VITAMIN B12: Vitamin B-12: 284 pg/mL (ref 200–1100)

## 2018-09-24 ENCOUNTER — Other Ambulatory Visit: Payer: Self-pay | Admitting: Adult Health

## 2018-10-09 ENCOUNTER — Other Ambulatory Visit: Payer: Self-pay | Admitting: Internal Medicine

## 2018-10-24 ENCOUNTER — Ambulatory Visit: Payer: Medicare Other | Admitting: Podiatry

## 2018-10-31 ENCOUNTER — Other Ambulatory Visit: Payer: Self-pay | Admitting: Internal Medicine

## 2018-11-13 ENCOUNTER — Ambulatory Visit: Payer: Medicare Other | Admitting: Podiatry

## 2018-12-11 ENCOUNTER — Ambulatory Visit: Payer: Medicare Other | Admitting: Physician Assistant

## 2018-12-11 ENCOUNTER — Other Ambulatory Visit: Payer: Self-pay

## 2018-12-11 ENCOUNTER — Encounter: Payer: Self-pay | Admitting: Physician Assistant

## 2018-12-11 VITALS — Ht 71.0 in

## 2018-12-11 DIAGNOSIS — R0602 Shortness of breath: Secondary | ICD-10-CM

## 2018-12-11 MED ORDER — ESOMEPRAZOLE MAGNESIUM 20 MG PO CPDR: 20.0000 mg | DELAYED_RELEASE_CAPSULE | Freq: Every day | ORAL | 0 refills | Status: DC

## 2018-12-11 NOTE — Progress Notes (Signed)
THIS ENCOUNTER IS A VIRTUAL VISIT DUE TO COVID-19 - PATIENT WAS NOT SEEN IN THE OFFICE.  PATIENT HAS CONSENTED TO VIRTUAL VISIT / TELEMEDICINE VISIT   Virtual Visit via telephone Note  I connected with [name] on 12/11/2018  by telephone.  I verified that I am speaking with the correct person using two identifiers.    I discussed the limitations of evaluation and management by telemedicine and the availability of in person appointments. The patient expressed understanding and agreed to proceed.  History of Present Illness: 85 y.o. WM with history of HTN, ASHD,CKD, CVA, pAfib NOT on coagulation due to age, CKD has intermittent SOB.  I'm talking with the son, patient is napping.  Son states x 2 weeks he is complaining of SOB in the evening. Then this AM he had SOB in the morning after he took a shower and some association with food, resolved within an hour.  No accompaniments, no fever, chills, cough, palpitations, no diarrhea, no stomach pain. He is very sedentary. He has had a decreased appetite, eating small meals with ensure. No leg swelling.  He is on an allergy pill. He has some sinus drainage.  He was switched from zantac to famotidine recently.  He has not left the house in 6 weeks.    Height 5\' 11"  (1.803 m).  Medications   Current Outpatient Medications (Cardiovascular):  .  bisoprolol (ZEBETA) 5 MG tablet, TAKE 1 TABLET ONCE DAILY. Marland Kitchen  losartan (COZAAR) 100 MG tablet, TAKE 1/2 TO 1 TABLET DAILY FOR BLOOD PRESSURE. .  verapamil (CALAN-SR) 240 MG CR tablet, TAKE 1 TABLET TWICE DAILY AFTER MEALS FOR HEART RHYTHM.  Current Outpatient Medications (Respiratory):  .  montelukast (SINGULAIR) 10 MG tablet, TAKE 1 TABLET ONCE A DAY FOR ALLERGIES.   Current Outpatient Medications (Hematological):  Marland Kitchen  ELIQUIS 5 MG TABS tablet, TAKE 1 TABLET BY MOUTH TWICE DAILY.  Current Outpatient Medications (Other):  Marland Kitchen  Cholecalciferol (VITAMIN D) 2000 units CAPS, Take 1 capsule by mouth  daily. .  famotidine (PEPCID) 40 MG tablet, Take 1 tablet at bedtime for Acid Reflux .  finasteride (PROSCAR) 5 MG tablet, TAKE (1) TABLET DAILY IN THE MORNING. .  Multiple Vitamin (MULTIVITAMIN) tablet, Take 1 tablet by mouth daily.  Problem list He has Hypertension; Other abnormal glucose; ASHD (arteriosclerotic heart disease); Vitamin D deficiency; Hyperlipidemia; Medication management; History of cerebral infarction; GERD ; BMI 24.0-24.9, adult; Environmental allergies; BPH (benign prostatic hyperplasia); Vascular dementia with depressed mood ([place]); CKD (chronic kidney disease) stage 3, GFR 30-59 ml/min ([place]); Paroxysmal atrial fibrillation (HCC); and Cerebrovascular accident (CVA) ([place]) on their problem list.   Observations/Objective: Spoke with son Dad has dementia, unable to give history well   Assessment and Plan:  SOB (shortness of breath) -     DG Chest 2 View; Future -     esomeprazole (NEXIUM) 20 MG capsule; Take 1 capsule (20 mg total) by mouth daily. No accompaniments Son wants to wait until Ov 05/18 but will call or go to ER if any changes.  Will try nexium for possible GERd and will get CXR On the 18th will rule out anemia, get EKG, etc.  Go to the ER if any chest pain, shortness of breath, nausea, dizziness, severe HA, changes vision/speech     Future Appointments  Date Time Provider [place]  12/20/2018  9:30 AM Marzetta Board, DPM [facility]  12/24/2018 11:00 AM Unk Pinto, MD [facility] None  06/27/2019 10:00 AM Unk Pinto,  MD [facility] None    Follow Up Instructions:  I discussed the assessment and treatment plan with the patient. The patient was provided an opportunity to ask questions and all were answered. The patient agreed with the plan and demonstrated an understanding of the instructions.   The patient was advised to call back or seek an in-person evaluation if the symptoms worsen or if the condition fails to improve  as anticipated.  I provided 15 minutes of non-face-to-face time during this encounter.   Vicie Mutters, PA-C

## 2018-12-20 ENCOUNTER — Ambulatory Visit: Payer: Medicare Other | Admitting: Podiatry

## 2018-12-23 ENCOUNTER — Encounter: Payer: Self-pay | Admitting: Internal Medicine

## 2018-12-23 NOTE — Progress Notes (Signed)
History of Present Illness:      This very nice 85 y.o. DWM presents for 6 month follow up with HTN, HLD, Pre-Diabetes and Vitamin D Deficiency. Patient has mild vascular dementia & is monitored closely by his children. Son - Mortimer Fries - reports short term memory is getting worse, but patient still attends to his personal  hygiene & needs.  Patient also has GERD controlled on his meds.      Patient is treated for HTN (1998) & BP has been controlled at home.  Patient has mild vascular Dementia and has hx/o CVA in 2017 and a 2sd CVA in Aug 2019 associated with new pAfib and was started on Eliquis (CHADsVASc 5).  Todays BP is at goal - 136/76. Patient has had no complaints of any cardiac type chest pain, palpitations,  orthopnea / PND, dizziness, claudication, or dependent edema. Patient's son reports patient has had recent sporadic c/o dyspnea.      Hyperlipidemia is controlled with diet & meds. Patient denies myalgias or other med SEs. Last Lipids were  Lab Results  Component Value Date   CHOL 137 09/19/2018   HDL 44 09/19/2018   LDLCALC 72 09/19/2018   TRIG 128 09/19/2018   CHOLHDL 3.1 09/19/2018       Also, the patient has history of PreDiabetes (A1c 5.7% / 2011)  and has had no symptoms of reactive hypoglycemia, diabetic polys, paresthesias or visual blurring.  Last A1c was Normal & at goal: Lab Results  Component Value Date   HGBA1C 5.1 06/08/2018      Further, the patient also has history of Vitamin D Deficiency ("30" / 2008)  and supplements vitamin D without any suspected side-effects. Last vitamin D was near goal (70-100):  Lab Results  Component Value Date   VD25OH 54 06/08/2018   Current Outpatient Medications on File Prior to Visit  Medication Sig   bisoprolol (ZEBETA) 5 MG tablet TAKE 1 TABLET ONCE DAILY.   Cholecalciferol (VITAMIN D) 2000 units CAPS Take 1 capsule by mouth daily.   ELIQUIS 5 MG TABS tablet TAKE 1 TABLET BY MOUTH TWICE DAILY.   famotidine (PEPCID) 40 MG  tablet Take 1 tablet at bedtime for Acid Reflux   finasteride (PROSCAR) 5 MG tablet TAKE (1) TABLET DAILY IN THE MORNING.   losartan (COZAAR) 100 MG tablet TAKE 1/2 TO 1 TABLET DAILY FOR BLOOD PRESSURE.   montelukast (SINGULAIR) 10 MG tablet TAKE 1 TABLET ONCE A DAY FOR ALLERGIES.   verapamil (CALAN-SR) 240 MG CR tablet TAKE 1 TABLET TWICE DAILY AFTER MEALS FOR HEART RHYTHM.   No current facility-administered medications on file prior to visit.    Allergies  Allergen Reactions   Demerol [Meperidine]    Penicillins    PMHx:   Past Medical History:  Diagnosis Date   ASHD (arteriosclerotic heart disease)    Hypertension    Prediabetes    Vitamin D deficiency    Immunization History  Administered Date(s) Administered   Influenza Split 06/03/2013   Influenza, High Dose Seasonal PF 05/20/2014, 05/05/2015, 06/06/2016, 07/10/2017, 06/26/2018   Pneumococcal Conjugate-13 03/02/2016   Pneumococcal Polysaccharide-23 10/27/2014   Td 08/09/2003, 02/27/2018   Zoster 08/25/2010   Past Surgical History:  Procedure Laterality Date   CATARACT EXTRACTION W/ INTRAOCULAR LENS IMPLANT Right 1993   CATARACT EXTRACTION W/ INTRAOCULAR LENS IMPLANT Left 2001   CHOLECYSTECTOMY  2001   COLECTOMY  1992   FHx:    Reviewed / unchanged  SHx:    Reviewed /  unchanged   Systems Review:  Constitutional: Denies fever, chills, wt changes, headaches, insomnia, fatigue, night sweats, change in appetite. Eyes: Denies redness, blurred vision, diplopia, discharge, itchy, watery eyes.  ENT: Denies discharge, congestion, post nasal drip, epistaxis, sore throat, earache, hearing loss, dental pain, tinnitus, vertigo, sinus pain, snoring.  CV: Denies chest pain, palpitations, irregular heartbeat, syncope, dyspnea, diaphoresis, orthopnea, PND, claudication or edema. Respiratory: denies cough, dyspnea, DOE, pleurisy, hoarseness, laryngitis, wheezing.  Gastrointestinal: Denies dysphagia, odynophagia,  heartburn, reflux, water brash, abdominal pain or cramps, nausea, vomiting, bloating, diarrhea, constipation, hematemesis, melena, hematochezia  or hemorrhoids. Genitourinary: Denies dysuria, frequency, urgency, nocturia, hesitancy, discharge, hematuria or flank pain. Musculoskeletal: Denies arthralgias, myalgias, stiffness, jt. swelling, pain, limping or strain/sprain.  Skin: Denies pruritus, rash, hives, warts, acne, eczema or change in skin lesion(s). Neuro: No weakness, tremor, incoordination, spasms, paresthesia or pain. Psychiatric: Denies confusion, memory loss or sensory loss. Endo: Denies change in weight, skin or hair change.  Heme/Lymph: No excessive bleeding, bruising or enlarged lymph nodes.  Physical Exam  BP 136/76    Pulse 63    Temp (!) 97.3 F (36.3 C)    Ht 5\' 11"  (1.803 m)    Wt 187 lb 12.8 oz (85.2 kg)    SpO2 95%    BMI 26.19 kg/m   Appears  well nourished, well groomed  and in no distress.  Eyes: PERRLA, EOMs, conjunctiva no swelling or erythema. Sinuses: No frontal/maxillary tenderness ENT/Mouth: EAC's clear, TM's nl w/o erythema, bulging. Nares clear w/o erythema, swelling, exudates. Oropharynx clear without erythema or exudates. Oral hygiene is good. Tongue normal, non obstructing. Hearing intact.  Neck: Supple. Thyroid not palpable. Car 2+/2+ without bruits, nodes or JVD. Chest: Respirations nl with BS clear & equal w/o rales, rhonchi, wheezing or stridor.  Cor: Heart sounds normal w/ regular rate and rhythm without sig. murmurs, gallops, clicks or rubs. Peripheral pulses normal and equal  without edema.  Abdomen: Soft & bowel sounds normal. Non-tender w/o guarding, rebound, hernias, masses or organomegaly.  Lymphatics: Unremarkable.  Musculoskeletal: Full ROM all peripheral extremities, joint stability, 5/5 strength and normal gait.  Skin: Warm, dry without exposed rashes, lesions or ecchymosis apparent.  Neuro: Cranial nerves intact, reflexes equal bilaterally.  Sensory-motor testing grossly intact. Tendon reflexes grossly intact.  Pysch: Alert & oriented x 3.  Insight and judgement nl & appropriate. No ideations.  Assessment and Plan:  1. Essential hypertension  - Continue medication, monitor blood pressure at home.  - Continue DASH diet.  Reminder to go to the ER if any CP,  SOB, nausea, dizziness, severe HA, changes vision/speech.  - CBC with Differential/Platelet - COMPLETE METABOLIC PANEL WITH GFR - Magnesium - TSH  2. Hyperlipidemia, mixed  - Continue diet/meds, exercise,& lifestyle modifications.  - Continue monitor periodic cholesterol/liver & renal functions   - Lipid panel - TSH  3. Abnormal glucose  - Continue diet, exercise  - Lifestyle modifications.  - Monitor appropriate labs.  - Hemoglobin A1c - Insulin, random  4. Vitamin D deficiency  - Continue supplementation.   - VITAMIN D 25 Hydroxyl  5. ASHD (arteriosclerotic heart disease)  - Lipid panel  6. Paroxysmal atrial fibrillation (HCC)  - TSH  7. Dyspnea due to congestive heart failure (HCC)  - Brain natriuretic peptide  8. Medication management  - CBC with Differential/Platelet - COMPLETE METABOLIC PANEL WITH GFR - Magnesium - Lipid panel - TSH - Hemoglobin A1c - Insulin, random - VITAMIN D 25 Hydroxyl - Brain natriuretic peptide  Discussed  regular exercise, BP monitoring, weight control to achieve/maintain BMI less than 25 and discussed med and SE's. Recommended labs to assess and monitor clinical status with further disposition pending results of labs. I discussed the assessment and treatment plan with the patient. The patient was provided an opportunity to ask questions and all were answered. The patient agreed with the plan and demonstrated an understanding of the instructions. I provided  25 minutes of exam, counseling, chart review and  complex critical decision making was performed   Kirtland Bouchard, MD

## 2018-12-23 NOTE — Patient Instructions (Signed)

## 2018-12-24 ENCOUNTER — Ambulatory Visit (INDEPENDENT_AMBULATORY_CARE_PROVIDER_SITE_OTHER): Payer: Medicare Other | Admitting: Internal Medicine

## 2018-12-24 ENCOUNTER — Other Ambulatory Visit: Payer: Self-pay

## 2018-12-24 ENCOUNTER — Encounter: Payer: Self-pay | Admitting: Internal Medicine

## 2018-12-24 VITALS — BP 136/76 | HR 63 | Temp 97.3°F | Ht 71.0 in | Wt 187.8 lb

## 2018-12-24 DIAGNOSIS — Z79899 Other long term (current) drug therapy: Secondary | ICD-10-CM

## 2018-12-24 DIAGNOSIS — I10 Essential (primary) hypertension: Secondary | ICD-10-CM

## 2018-12-24 DIAGNOSIS — I509 Heart failure, unspecified: Secondary | ICD-10-CM | POA: Diagnosis not present

## 2018-12-24 DIAGNOSIS — E782 Mixed hyperlipidemia: Secondary | ICD-10-CM | POA: Diagnosis not present

## 2018-12-24 DIAGNOSIS — R7309 Other abnormal glucose: Secondary | ICD-10-CM | POA: Diagnosis not present

## 2018-12-24 DIAGNOSIS — I2510 Atherosclerotic heart disease of native coronary artery without angina pectoris: Secondary | ICD-10-CM | POA: Diagnosis not present

## 2018-12-24 DIAGNOSIS — E559 Vitamin D deficiency, unspecified: Secondary | ICD-10-CM | POA: Diagnosis not present

## 2018-12-24 DIAGNOSIS — I480 Paroxysmal atrial fibrillation: Secondary | ICD-10-CM | POA: Diagnosis not present

## 2018-12-25 LAB — CBC WITH DIFFERENTIAL/PLATELET
Absolute Monocytes: 662 cells/uL (ref 200–950)
Basophils Absolute: 13 cells/uL (ref 0–200)
Basophils Relative: 0.2 %
Eosinophils Absolute: 139 cells/uL (ref 15–500)
Eosinophils Relative: 2.2 %
HCT: 37 % — ABNORMAL LOW (ref 38.5–50.0)
Hemoglobin: 12.1 g/dL — ABNORMAL LOW (ref 13.2–17.1)
Lymphs Abs: 1506 cells/uL (ref 850–3900)
MCH: 30 pg (ref 27.0–33.0)
MCHC: 32.7 g/dL (ref 32.0–36.0)
MCV: 91.6 fL (ref 80.0–100.0)
MPV: 10.5 fL (ref 7.5–12.5)
Monocytes Relative: 10.5 %
Neutro Abs: 3982 cells/uL (ref 1500–7800)
Neutrophils Relative %: 63.2 %
Platelets: 264 10*3/uL (ref 140–400)
RBC: 4.04 10*6/uL — ABNORMAL LOW (ref 4.20–5.80)
RDW: 11.9 % (ref 11.0–15.0)
Total Lymphocyte: 23.9 %
WBC: 6.3 10*3/uL (ref 3.8–10.8)

## 2018-12-25 LAB — COMPLETE METABOLIC PANEL WITH GFR
AG Ratio: 1.4 (calc) (ref 1.0–2.5)
ALT: 11 U/L (ref 9–46)
AST: 16 U/L (ref 10–35)
Albumin: 3.8 g/dL (ref 3.6–5.1)
Alkaline phosphatase (APISO): 68 U/L (ref 35–144)
BUN: 16 mg/dL (ref 7–25)
CO2: 31 mmol/L (ref 20–32)
Calcium: 9.5 mg/dL (ref 8.6–10.3)
Chloride: 105 mmol/L (ref 98–110)
Creat: 1.03 mg/dL (ref 0.70–1.11)
GFR, Est African American: 72 mL/min/{1.73_m2} (ref >=60)
GFR, Est Non African American: 62 mL/min/{1.73_m2} (ref >=60)
Globulin: 2.8 g/dL (calc) (ref 1.9–3.7)
Glucose, Bld: 93 mg/dL (ref 65–99)
Potassium: 4.3 mmol/L (ref 3.5–5.3)
Sodium: 141 mmol/L (ref 135–146)
Total Bilirubin: 0.6 mg/dL (ref 0.2–1.2)
Total Protein: 6.6 g/dL (ref 6.1–8.1)

## 2018-12-25 LAB — LIPID PANEL
Cholesterol: 154 mg/dL (ref <200)
HDL: 49 mg/dL (ref >=40)
LDL Cholesterol (Calc): 86 mg/dL (calc)
Non-HDL Cholesterol (Calc): 105 mg/dL (calc) (ref <130)
Total CHOL/HDL Ratio: 3.1 (calc) (ref <5.0)
Triglycerides: 92 mg/dL (ref <150)

## 2018-12-25 LAB — INSULIN, RANDOM: Insulin: 5.3 u[IU]/mL

## 2018-12-25 LAB — MAGNESIUM: Magnesium: 2.2 mg/dL (ref 1.5–2.5)

## 2018-12-25 LAB — HEMOGLOBIN A1C
Hgb A1c MFr Bld: 5.1 % of total Hgb (ref <5.7)
Mean Plasma Glucose: 100 (calc)
eAG (mmol/L): 5.5 (calc)

## 2018-12-25 LAB — TSH: TSH: 1.61 mIU/L (ref 0.40–4.50)

## 2018-12-25 LAB — BRAIN NATRIURETIC PEPTIDE: Brain Natriuretic Peptide: 153 pg/mL — ABNORMAL HIGH (ref <100)

## 2018-12-25 LAB — VITAMIN D 25 HYDROXY (VIT D DEFICIENCY, FRACTURES): Vit D, 25-Hydroxy: 50 ng/mL (ref 30–100)

## 2019-01-14 ENCOUNTER — Other Ambulatory Visit: Payer: Self-pay

## 2019-01-14 ENCOUNTER — Encounter: Payer: Self-pay | Admitting: Podiatry

## 2019-01-14 ENCOUNTER — Ambulatory Visit (INDEPENDENT_AMBULATORY_CARE_PROVIDER_SITE_OTHER): Payer: Medicare Other | Admitting: Podiatry

## 2019-01-14 VITALS — Temp 97.5°F

## 2019-01-14 DIAGNOSIS — B351 Tinea unguium: Secondary | ICD-10-CM

## 2019-01-14 DIAGNOSIS — M79674 Pain in right toe(s): Secondary | ICD-10-CM | POA: Diagnosis not present

## 2019-01-14 DIAGNOSIS — M79675 Pain in left toe(s): Secondary | ICD-10-CM | POA: Diagnosis not present

## 2019-01-14 NOTE — Patient Instructions (Signed)

## 2019-01-17 NOTE — Progress Notes (Signed)
Subjective:  Joseph Cherry presents to clinic today with cc of  painful, thick, discolored, elongated toenails 1-5 b/l that become tender and cannot cut because of thickness.  Pain is aggravated when wearing enclosed shoe gear.  Unk Pinto, MD is his PCP.    Current Outpatient Medications:  .  bisoprolol (ZEBETA) 5 MG tablet, TAKE 1 TABLET ONCE DAILY., Disp: 90 tablet, Rfl: 3 .  Cholecalciferol (VITAMIN D) 2000 units CAPS, Take 1 capsule by mouth daily., Disp: , Rfl:  .  ELIQUIS 5 MG TABS tablet, TAKE 1 TABLET BY MOUTH TWICE DAILY., Disp: 180 tablet, Rfl: 3 .  famotidine (PEPCID) 40 MG tablet, Take 1 tablet at bedtime for Acid Reflux, Disp: 90 tablet, Rfl: 3 .  finasteride (PROSCAR) 5 MG tablet, TAKE (1) TABLET DAILY IN THE MORNING., Disp: 90 tablet, Rfl: 3 .  losartan (COZAAR) 100 MG tablet, TAKE 1/2 TO 1 TABLET DAILY FOR BLOOD PRESSURE., Disp: 90 tablet, Rfl: 1 .  montelukast (SINGULAIR) 10 MG tablet, TAKE 1 TABLET ONCE A DAY FOR ALLERGIES., Disp: 30 tablet, Rfl: PRN .  verapamil (CALAN-SR) 240 MG CR tablet, TAKE 1 TABLET TWICE DAILY AFTER MEALS FOR HEART RHYTHM., Disp: 180 tablet, Rfl: 0   Allergies  Allergen Reactions  . Demerol [Meperidine]   . Penicillins      Objective: Vitals:   01/14/19 0947  Temp: (!) 97.5 F (36.4 C)    Physical Examination:  Vascular Examination: Capillary refill time <3 seconds x 10 digits.  Faintly palpable DP/PT pulses b/l.  Digital hair absent b/l.  No edema noted b/l.  Skin temperature gradient WNL b/l.  Dermatological Examination: Skin with normal turgor, texture and tone b/l.  No open wounds b/l.  No interdigital macerations noted b/l.  Elongated, thick, discolored brittle toenails with subungual debris and pain on dorsal palpation of nailbeds 1-5 b/l.  Musculoskeletal Examination: Muscle strength 5/5 to all muscle groups b/l.  Mildly reducible hammertoe deformity 2-5 b/l.  No pain, crepitus or joint discomfort with  active/passive ROM.  Neurological Examination: Sensation intact 5/5 b/l with 10 gram monofilament.  Vibratory sensation intact b/l.  Assessment: Mycotic nail infection with pain 1-5 b/l  Plan: 1. Toenails 1-5 b/l were debrided in length and girth without iatrogenic laceration. 2.  Continue soft, supportive shoe gear daily. 3.  Report any pedal injuries to medical professional. 4.  Follow up 3 months. 5.  Patient/POA to call should there be a question/concern in there interim.

## 2019-02-04 ENCOUNTER — Other Ambulatory Visit: Payer: Self-pay | Admitting: Internal Medicine

## 2019-02-12 ENCOUNTER — Telehealth: Payer: Self-pay | Admitting: *Deleted

## 2019-02-12 ENCOUNTER — Telehealth: Payer: Medicare Other | Admitting: Family

## 2019-02-12 DIAGNOSIS — L739 Follicular disorder, unspecified: Secondary | ICD-10-CM

## 2019-02-12 MED ORDER — CLINDAMYCIN PHOSPHATE 1 % EX GEL: Freq: Two times a day (BID) | CUTANEOUS | 0 refills | Status: DC

## 2019-02-12 NOTE — Telephone Encounter (Signed)
Spoke with son and advised Dr Melford Aase had seen the patient had an E visit regarding his rash. Per the son, he will be home in [place] next week and will call, if rash persist.

## 2019-02-12 NOTE — Progress Notes (Signed)
E Visit for Rash  We are sorry that you are not feeling well. Here is how we plan to help!  You appear to have folliculitis. This is an infection of the hair follicles that requires antibiotics.  Topical clindamycin applied to the area twice a day    HOME CARE:   Take cool showers and avoid direct sunlight.  Apply cool compress or wet dressings.  Take a bath in an oatmeal bath.  Sprinkle content of one Aveeno packet under running faucet with comfortably warm water.  Bathe for 15-20 minutes, 1-2 times daily.  Pat dry with a towel. Do not rub the rash.  Use hydrocortisone cream.  Take an antihistamine like Benadryl for widespread rashes that itch.  The adult dose of Benadryl is 25-50 mg by mouth 4 times daily.  Caution:  This type of medication may cause sleepiness.  Do not drink alcohol, drive, or operate dangerous machinery while taking antihistamines.  Do not take these medications if you have prostate enlargement.  Read package instructions thoroughly on all medications that you take.  GET HELP RIGHT AWAY IF:   Symptoms don't go away after treatment.  Severe itching that persists.  If you rash spreads or swells.  If you rash begins to smell.  If it blisters and opens or develops a yellow-brown crust.  You develop a fever.  You have a sore throat.  You become short of breath.  MAKE SURE YOU:  Understand these instructions. Will watch your condition. Will get help right away if you are not doing well or get worse.  Thank you for choosing an e-visit. Your e-visit answers were reviewed by a board certified advanced clinical practitioner to complete your personal care plan. Depending upon the condition, your plan could have included both over the counter or prescription medications. Please review your pharmacy choice. Be sure that the pharmacy you have chosen is open so that you can pick up your prescription now.  If there is a problem you may message your provider in  [place] to have the prescription routed to another pharmacy. Your safety is important to [place]. If you have drug allergies check your prescription carefully.  For the next 24 hours, you can use MyChart to ask questions about today's visit, request a non-urgent call back, or ask for a work or school excuse from your e-visit provider. You will get an email in the next two days asking about your experience. I hope that your e-visit has been valuable and will speed your recovery.    Greater than 5 minutes, yet less than 10 minutes of time have been spent researching, coordinating, and implementing care for this patient today.  Thank you for the details you included in the comment boxes. Those details are very helpful in determining the best course of treatment for you and help [place] to provide the best care.

## 2019-03-13 ENCOUNTER — Other Ambulatory Visit: Payer: Self-pay | Admitting: Physician Assistant

## 2019-04-01 ENCOUNTER — Ambulatory Visit: Payer: Medicare Other | Admitting: Physician Assistant

## 2019-04-22 ENCOUNTER — Other Ambulatory Visit: Payer: Self-pay

## 2019-04-22 ENCOUNTER — Encounter: Payer: Self-pay | Admitting: Podiatry

## 2019-04-22 ENCOUNTER — Ambulatory Visit (INDEPENDENT_AMBULATORY_CARE_PROVIDER_SITE_OTHER): Payer: Medicare Other | Admitting: Podiatry

## 2019-04-22 DIAGNOSIS — B351 Tinea unguium: Secondary | ICD-10-CM

## 2019-04-22 DIAGNOSIS — M79675 Pain in left toe(s): Secondary | ICD-10-CM

## 2019-04-22 DIAGNOSIS — M79674 Pain in right toe(s): Secondary | ICD-10-CM

## 2019-04-22 DIAGNOSIS — Z9229 Personal history of other drug therapy: Secondary | ICD-10-CM

## 2019-04-22 NOTE — Patient Instructions (Signed)

## 2019-04-22 NOTE — Progress Notes (Signed)
Subjective: Joseph Cherry is seen today for follow up painful, elongated, thickened toenails 1-5 b/l feet that he cannot cut. Pain interferes with daily activities. Aggravating factor includes wearing enclosed shoe gear and relieved with periodic debridement.  He is accompanied by his son on today's visit. He nor son voice any new pedal concerns on today's visit.  He remains on blood thinner, Eliquis.  Objective:  Neurovascular status unchanged: CFT <3 seconds x 10 digits.  Faintly palpable pedal pulses b/l.  Digital hair absent b/l.  No pedal edema noted b/l.  Skin temperature gradient WNL b/l.  Sensation intact 5/5 b/l with 10 gram monofilament.  Dermatological Examination: Skin with normal turgor, texture and tone b/l.  Toenails 1-5 b/l discolored, thick, dystrophic with subungual debris and pain with palpation to nailbeds due to thickness of nails.  Musculoskeletal: Muscle strength 5/5 to all LE muscle groups  Reducible hammertoe deformities lesser digits b/l.  No pain, crepitus or joint limitation noted with ROM.   Assessment: Painful onychomycosis toenails 1-5 b/l  Patient on long term blood thinner, Eliquis  Plan: 1. Toenails 1-5 b/l were debrided in length and girth without iatrogenic bleeding. 2. Patient to continue soft, supportive shoe gear 3. Patient to report any pedal injuries to medical professional immediately. 4. Follow up 3 months.  5. Patient/POA to call should there be a concern in the interim.

## 2019-05-08 ENCOUNTER — Encounter: Payer: Self-pay | Admitting: Adult Health

## 2019-05-08 NOTE — Progress Notes (Signed)
MEDICARE ANNUAL WELLNESS VISIT AND FOLLOW UP Assessment:   Encounter for annual medicare wellness visit  ASHD (arteriosclerotic heart disease) -cont BP control -not on statin secondary to age  P. A. Fib Rate controlled; continue elequis for CHADsVASc 5 Followed by Dr. Caryl Comes  Essential hypertension -cont meds -well controlled -dash diet -exercise as tolerated  Gastroesophageal reflux disease, esophagitis presence not specified -zantac prn -avoid trigger foods  History of hemorrhagic CVA -historical -cont BP control  BMI 26 -well controlled  Hyperlipidemia -cont diet and exercise - defer lipid panel as wouldn't aggressively treat  Medication management -monitor labs biyearly  Other abnormal glucose -cont diet and exercise  Vitamin D deficiency -cont Vit D supplement  Vascular dementia with depressed mood -patient does not want to take any medications for memory -continue to keep notes, follow routine - off remeron per cardiology and maintaining weight/mood -if difficulty with medications we can get home health out there to help - mme 17 today   BPH Continue medications  Environmental allergies Avoid triggers, continue singulair  CKD III Increase fluids, avoid NSAIDS, monitor sugars, will monitor  Need for influenza vaccine - high dose quadrivalent administered today without complication   Over 30 minutes of exam, counseling, chart review, and critical decision making was performed  Future Appointments  Date Time Provider [place]  06/27/2019 10:00 AM Unk Pinto, MD [facility] None  07/23/2019  9:15 AM Marzetta Board, DPM [facility]     Plan:   During the course of the visit the patient was educated and counseled about appropriate screening and preventive services including:    Pneumococcal vaccine   Influenza vaccine  Prevnar 13  Td vaccine  Screening electrocardiogram  Colorectal cancer  screening  Diabetes screening  Glaucoma screening  Nutrition counseling    Subjective:  Joseph Cherry is a 85 y.o. male who presents for Medicare Annual Wellness Visit and 3 month follow up for HTN, hyperlipidemia, glucose management, and vitamin D Def.   Patient has mild vascular Dementia and has hx of CVA in 2017 and a 2nd CVA in Aug 2019 associated with new pAfib and was started on Eliquis (CHADsVASc 5). Follows with Dr. Caryl Comes.   He has reported memory changes in recent years; He is accompanied by his daughter; son Joseph Cherry has POA; family helps by keeping a calender, and he keeps notes for himself. Uses a pill box for meds. He is on remeron for mood and appetite. Still living at home by himself with frequent close supervision (family checks twice daily), will probably be moving in with son soon, anticipated in 2 weeks, moving 4 hours away.   BMI is Body mass index is 26.36 kg/m.,  Wt Readings from Last 3 Encounters:  05/09/19 189 lb (85.7 kg)  12/24/18 187 lb 12.8 oz (85.2 kg)  09/19/18 187 lb (84.8 kg)   He has BP cuff at home but not checking recently, today their BP is BP: 122/68 He does not workout. He denies chest pain, shortness of breath, dizziness.    He is not on cholesterol medication and denies myalgias. His cholesterol is at goal. The cholesterol last visit was:   Lab Results  Component Value Date   CHOL 154 12/24/2018   HDL 49 12/24/2018   LDLCALC 86 12/24/2018   TRIG 92 12/24/2018   CHOLHDL 3.1 12/24/2018   : Blood sugars have been well controlled with diet and exercise.  He does walk daily.  Lab Results  Component Value Date  HGBA1C 5.1 12/24/2018   Last GFR Lab Results  Component Value Date   GFRNONAA 62 12/24/2018   Patient is on Vitamin D supplement.   Lab Results  Component Value Date   VD25OH 50 12/24/2018         Medication Review: Current Outpatient Medications on File Prior to Visit  Medication Sig Dispense Refill  . bisoprolol  (ZEBETA) 5 MG tablet TAKE 1 TABLET ONCE DAILY. 90 tablet 3  . Cholecalciferol (VITAMIN D) 2000 units CAPS Take 1 capsule by mouth daily.    . CYANOCOBALAMIN SL Place 1 tablet under the tongue daily.    Marland Kitchen ELIQUIS 5 MG TABS tablet TAKE 1 TABLET BY MOUTH TWICE DAILY. 180 tablet 3  . famotidine (PEPCID) 40 MG tablet Take 1 tablet at bedtime for Acid Reflux 90 tablet 3  . finasteride (PROSCAR) 5 MG tablet TAKE (1) TABLET DAILY IN THE MORNING. 90 tablet 3  . losartan (COZAAR) 100 MG tablet Take 1/2 to 1 tablet Daily for BP 90 tablet 3  . montelukast (SINGULAIR) 10 MG tablet TAKE 1 TABLET ONCE A DAY FOR ALLERGIES. 30 tablet PRN  . verapamil (CALAN-SR) 240 MG CR tablet TAKE 1 TABLET TWICE DAILY AFTER MEALS FOR HEART RHYTHM. 180 tablet 0   No current facility-administered medications on file prior to visit.     Allergies: Allergies  Allergen Reactions  . Demerol [Meperidine]   . Penicillins     Current Problems (verified) has Hypertension; Other abnormal glucose; ASHD (arteriosclerotic heart disease); Vitamin D deficiency; Hyperlipidemia; Medication management; History of cerebral infarction; GERD ; BMI 26.0-26.9,adult; Environmental allergies; BPH (benign prostatic hyperplasia); Vascular dementia with depressed mood ([place]); CKD (chronic kidney disease) stage 3, GFR 30-59 ml/min; and Paroxysmal atrial fibrillation (HCC) on their problem list.  Screening Tests Immunization History  Administered Date(s) Administered  . Influenza Split 06/03/2013  . Influenza, High Dose Seasonal PF 05/20/2014, 05/05/2015, 06/06/2016, 07/10/2017, 06/26/2018  . Pneumococcal Conjugate-13 03/02/2016  . Pneumococcal Polysaccharide-23 10/27/2014  . Td 08/09/2003, 02/27/2018  . Zoster 08/25/2010    Preventative care: Last colonoscopy: remote, aged out  Prior vaccinations: TD or Tdap: 2019  Influenza: 2019 DUE  Pneumococcal: 2016 Prevnar13: 2017 Shingles/Zostavax: 2012  Names of Other Physician/Practitioners  you currently use: 1. [facility] here for primary care 2. Dr. Herbert Deaner, eye doctor, last visit 2019, wears glasses  3. Ronnald Ramp, dentist, 2019  Patient Care Team: Unk Pinto, MD as PCP - General (Internal Medicine) Carolan Clines, MD (Inactive) as Consulting Physician (Urology)  Surgical: He  has a past surgical history that includes Colectomy (1992); Cataract extraction w/ intraocular lens implant (Right, 1993); Cholecystectomy (2001); and Cataract extraction w/ intraocular lens implant (Left, 2001). Family His family history includes AAA (abdominal aortic aneurysm) in his mother; Early death in his brother; Heart disease in his father, mother, and sister; Hypertension in his mother. Social history  He reports that he quit smoking about 4 years ago. His smoking use included cigars. He has never used smokeless tobacco. He reports current alcohol use of about 7.0 standard drinks of alcohol per week. He reports that he does not use drugs.  MEDICARE WELLNESS OBJECTIVES: Physical activity: Current Exercise Habits: The patient does not participate in regular exercise at present, Exercise limited by: None identified;neurologic condition(s) Cardiac risk factors: Cardiac Risk Factors include: advanced age (>46men, >37 women);male gender;hypertension;dyslipidemia;sedentary lifestyle Depression/mood screen:   Depression screen [facility] 2/9 05/09/2019  Decreased Interest 0  Down, Depressed, Hopeless 0  PHQ -  2 Score 0  Altered sleeping -  Tired, decreased energy -  Change in appetite -  Feeling bad or failure about yourself  -  Trouble concentrating -  Moving slowly or fidgety/restless -  Suicidal thoughts -  PHQ-9 Score -  Difficult doing work/chores -    ADLs:  In your present state of health, do you have any difficulty performing the following activities: 05/09/2019 12/23/2018  Hearing? N N  Vision? N N  Difficulty concentrating or making decisions?  Y N  Walking or climbing stairs? N N  Dressing or bathing? N N  Doing errands, shopping? Y N  Comment family drives -  Conservation officer, nature and eating ? Y -  Comment family brings food, he can feed himself -  Using the Toilet? N -  In the past six months, have you accidently leaked urine? N -  Do you have problems with loss of bowel control? N -  Managing your Medications? Y -  Comment family assists -  Managing your Finances? Y -  Comment family assists -  Housekeeping or managing your Housekeeping? Y -  Comment family assists -  Some recent data might be hidden     Cognitive Testing   MMSE - Mini Mental State Exam 05/09/2019 02/27/2018  Orientation to time 0 0  Orientation to Place 5 5  Registration 2 3  Attention/ Calculation 2 2  Recall 0 0  Language- name 2 objects 2 2  Language- repeat 1 1  Language- follow 3 step command 3 3  Language- read & follow direction 1 1  Write a sentence 1 1  Copy design 0 1  Total score 17 19    EOL planning: Does Patient Have a Medical Advance Directive?: Yes Type of Advance Directive: Healthcare Power of Attorney, Living will Does patient want to make changes to medical advance directive?: No - Patient declined Copy of [place] in Chart?: Yes - validated most recent copy scanned in chart (See row information)   Objective:   Today's Vitals   05/09/19 1352  BP: 122/68  Pulse: 72  Temp: 97.7 F (36.5 C)  SpO2: 93%  Weight: 189 lb (85.7 kg)  Height: 5\' 11"  (1.803 m)   Body mass index is 26.36 kg/m.  General appearance: alert, no distress, WD/WN, male HEENT: normocephalic, sclerae anicteric, TMs pearly, nares patent, no discharge or erythema, pharynx normal Oral cavity: MMM, no lesions Neck: supple, no lymphadenopathy, no thyromegaly, no masses Heart: RRR, normal S1, S2, no murmurs Lungs: CTA bilaterally, no wheezes, rhonchi, or rales Abdomen: +bs, soft, non tender, non distended, no masses, no hepatomegaly, no  splenomegaly Musculoskeletal: nontender, no swelling, no obvious deformity Extremities: no edema, no cyanosis, no clubbing Pulses: 2+ symmetric, upper and lower extremities, normal cap refill Neurological: alert, oriented x 2, CN2-12 intact, strength normal upper extremities and lower extremities, sensation normal throughout, DTRs 2+ throughout, no cerebellar signs, slow shuffling gait, poor short term recall Psychiatric: normal affect, behavior normal, pleasant   Medicare Attestation I have personally reviewed: The patient's medical and social history Their use of alcohol, tobacco or illicit drugs Their current medications and supplements The patient's functional ability including ADLs,fall risks, home safety risks, cognitive, and hearing and visual impairment Diet and physical activities Evidence for depression or mood disorders  The patient's weight, height, BMI, and visual acuity have been recorded in the chart.  I have made referrals, counseling, and provided education to the patient based on review of the  above and I have provided the patient with a written personalized care plan for preventive services.     Izora Ribas, NP   05/09/2019

## 2019-05-09 ENCOUNTER — Encounter: Payer: Self-pay | Admitting: Adult Health

## 2019-05-09 ENCOUNTER — Ambulatory Visit (INDEPENDENT_AMBULATORY_CARE_PROVIDER_SITE_OTHER): Payer: Medicare Other | Admitting: Adult Health

## 2019-05-09 ENCOUNTER — Other Ambulatory Visit: Payer: Self-pay

## 2019-05-09 VITALS — BP 122/68 | HR 72 | Temp 97.7°F | Ht 71.0 in | Wt 189.0 lb

## 2019-05-09 DIAGNOSIS — I10 Essential (primary) hypertension: Secondary | ICD-10-CM | POA: Diagnosis not present

## 2019-05-09 DIAGNOSIS — N400 Enlarged prostate without lower urinary tract symptoms: Secondary | ICD-10-CM

## 2019-05-09 DIAGNOSIS — R7309 Other abnormal glucose: Secondary | ICD-10-CM | POA: Diagnosis not present

## 2019-05-09 DIAGNOSIS — R6889 Other general symptoms and signs: Secondary | ICD-10-CM | POA: Diagnosis not present

## 2019-05-09 DIAGNOSIS — Z23 Encounter for immunization: Secondary | ICD-10-CM | POA: Diagnosis not present

## 2019-05-09 DIAGNOSIS — K219 Gastro-esophageal reflux disease without esophagitis: Secondary | ICD-10-CM

## 2019-05-09 DIAGNOSIS — E782 Mixed hyperlipidemia: Secondary | ICD-10-CM | POA: Diagnosis not present

## 2019-05-09 DIAGNOSIS — I2510 Atherosclerotic heart disease of native coronary artery without angina pectoris: Secondary | ICD-10-CM | POA: Diagnosis not present

## 2019-05-09 DIAGNOSIS — F0153 Vascular dementia, unspecified severity, with mood disturbance: Secondary | ICD-10-CM

## 2019-05-09 DIAGNOSIS — Z0001 Encounter for general adult medical examination with abnormal findings: Secondary | ICD-10-CM | POA: Diagnosis not present

## 2019-05-09 DIAGNOSIS — Z6826 Body mass index (BMI) 26.0-26.9, adult: Secondary | ICD-10-CM

## 2019-05-09 DIAGNOSIS — Z79899 Other long term (current) drug therapy: Secondary | ICD-10-CM | POA: Diagnosis not present

## 2019-05-09 DIAGNOSIS — Z9109 Other allergy status, other than to drugs and biological substances: Secondary | ICD-10-CM

## 2019-05-09 DIAGNOSIS — I480 Paroxysmal atrial fibrillation: Secondary | ICD-10-CM

## 2019-05-09 DIAGNOSIS — E559 Vitamin D deficiency, unspecified: Secondary | ICD-10-CM

## 2019-05-09 DIAGNOSIS — Z8673 Personal history of transient ischemic attack (TIA), and cerebral infarction without residual deficits: Secondary | ICD-10-CM

## 2019-05-09 DIAGNOSIS — F0151 Vascular dementia with behavioral disturbance: Secondary | ICD-10-CM

## 2019-05-09 DIAGNOSIS — N182 Chronic kidney disease, stage 2 (mild): Secondary | ICD-10-CM

## 2019-05-09 DIAGNOSIS — Z0000 Encounter for general adult medical examination without abnormal findings: Secondary | ICD-10-CM

## 2019-05-09 NOTE — Addendum Note (Signed)
Addended by: Chancy Hurter on: 05/09/2019 03:01 PM   Modules accepted: Orders

## 2019-05-09 NOTE — Patient Instructions (Addendum)
  Joseph Cherry , Thank you for taking time to come for your Medicare Wellness Visit. I appreciate your ongoing commitment to your health goals. Please review the following plan we discussed and let me know if I can assist you in the future.   These are the goals we discussed: Goals    . Blood Pressure < 140/80       This is a list of the screening recommended for you and due dates:  Health Maintenance  Topic Date Due  . Flu Shot  03/09/2019  . Tetanus Vaccine  02/28/2028  . Pneumonia vaccines  Completed    Please monitor your blood pressure, as we get older our body can not respond to a low blood pressure as well as it did when we were younger, for this reason we want a bit higher of a blood pressure as you get older to avoid dizziness and fatigue which can lead to falls. [place] call if your blood pressure is consistently above 150/90.   Please monitor your blood pressure. If it is getting below 130/80 AND you are having fatigue with exertion, dizziness we may need to cut your blood pressure medication in half. Please call the office if this is happening.  Hypotension As your heart beats, it forces blood through your body. This force is called blood pressure. If you have hypotension, you have low blood pressure. When your blood pressure is too low, you may not get enough blood to your brain. You may feel weak, feel lightheaded, have a fast heartbeat, or even pass out (faint). HOME CARE  Drink enough fluids to keep your pee (urine) clear or pale yellow.  Take all medicines as told by your doctor.  Get up slowly after sitting or lying down.  Wear support stockings as told by your doctor.  Maintain a healthy diet by including foods such as fruits, vegetables, nuts, whole grains, and lean meats. GET HELP IF:  You are throwing up (vomiting) or have watery poop (diarrhea).  You have a fever for more than 2-3 days.  You feel more thirsty than usual.  You feel weak and tired. GET HELP  RIGHT AWAY IF:   You pass out (faint).  You have chest pain or a fast or irregular heartbeat.  You lose feeling in part of your body.  You cannot move your arms or legs.  You have trouble speaking.  You get sweaty or feel lightheaded. MAKE SURE YOU:   Understand these instructions.  Will watch your condition.  Will get help right away if you are not doing well or get worse. Document Released: 10/19/2009 Document Revised: 03/27/2013 Document Reviewed: 01/25/2013 [facility] Patient Information 2015 [place], [place]. This information is not intended to replace advice given to you by your health care provider. Make sure you discuss any questions you have with your health care provider.

## 2019-05-10 ENCOUNTER — Encounter: Payer: Self-pay | Admitting: Adult Health

## 2019-05-10 DIAGNOSIS — D649 Anemia, unspecified: Secondary | ICD-10-CM | POA: Insufficient documentation

## 2019-05-10 LAB — CBC WITH DIFFERENTIAL/PLATELET
Absolute Monocytes: 530 cells/uL (ref 200–950)
Basophils Absolute: 20 cells/uL (ref 0–200)
Basophils Relative: 0.4 %
Eosinophils Absolute: 102 cells/uL (ref 15–500)
Eosinophils Relative: 2 %
HCT: 34 % — ABNORMAL LOW (ref 38.5–50.0)
Hemoglobin: 11.2 g/dL — ABNORMAL LOW (ref 13.2–17.1)
Lymphs Abs: 1071 cells/uL (ref 850–3900)
MCH: 29.9 pg (ref 27.0–33.0)
MCHC: 32.9 g/dL (ref 32.0–36.0)
MCV: 90.7 fL (ref 80.0–100.0)
MPV: 10.6 fL (ref 7.5–12.5)
Monocytes Relative: 10.4 %
Neutro Abs: 3376 cells/uL (ref 1500–7800)
Neutrophils Relative %: 66.2 %
Platelets: 238 10*3/uL (ref 140–400)
RBC: 3.75 10*6/uL — ABNORMAL LOW (ref 4.20–5.80)
RDW: 12.6 % (ref 11.0–15.0)
Total Lymphocyte: 21 %
WBC: 5.1 10*3/uL (ref 3.8–10.8)

## 2019-05-10 LAB — COMPLETE METABOLIC PANEL WITH GFR
AG Ratio: 1.3 (calc) (ref 1.0–2.5)
ALT: 7 U/L — ABNORMAL LOW (ref 9–46)
AST: 14 U/L (ref 10–35)
Albumin: 3.5 g/dL — ABNORMAL LOW (ref 3.6–5.1)
Alkaline phosphatase (APISO): 71 U/L (ref 35–144)
BUN: 15 mg/dL (ref 7–25)
CO2: 27 mmol/L (ref 20–32)
Calcium: 9.2 mg/dL (ref 8.6–10.3)
Chloride: 105 mmol/L (ref 98–110)
Creat: 1.04 mg/dL (ref 0.70–1.11)
GFR, Est African American: 70 mL/min/{1.73_m2} (ref >=60)
GFR, Est Non African American: 61 mL/min/{1.73_m2} (ref >=60)
Globulin: 2.6 g/dL (calc) (ref 1.9–3.7)
Glucose, Bld: 125 mg/dL — ABNORMAL HIGH (ref 65–99)
Potassium: 4.5 mmol/L (ref 3.5–5.3)
Sodium: 140 mmol/L (ref 135–146)
Total Bilirubin: 0.6 mg/dL (ref 0.2–1.2)
Total Protein: 6.1 g/dL (ref 6.1–8.1)

## 2019-05-10 LAB — TSH: TSH: 2.24 mIU/L (ref 0.40–4.50)

## 2019-05-10 LAB — MAGNESIUM: Magnesium: 2.1 mg/dL (ref 1.5–2.5)

## 2019-06-22 ENCOUNTER — Other Ambulatory Visit: Payer: Self-pay | Admitting: Adult Health

## 2019-06-27 ENCOUNTER — Encounter: Payer: Self-pay | Admitting: Internal Medicine

## 2019-07-15 ENCOUNTER — Other Ambulatory Visit: Payer: Self-pay | Admitting: Internal Medicine

## 2019-07-15 DIAGNOSIS — K219 Gastro-esophageal reflux disease without esophagitis: Secondary | ICD-10-CM

## 2019-07-23 ENCOUNTER — Ambulatory Visit: Payer: Medicare Other | Admitting: Podiatry

## 2019-08-20 ENCOUNTER — Other Ambulatory Visit: Payer: Self-pay | Admitting: Internal Medicine

## 2019-08-20 MED ORDER — APIXABAN 5 MG PO TABS: ORAL_TABLET | ORAL | 2 refills | Status: DC

## 2019-09-05 DIAGNOSIS — I10 Essential (primary) hypertension: Secondary | ICD-10-CM | POA: Diagnosis not present

## 2019-09-05 DIAGNOSIS — I639 Cerebral infarction, unspecified: Secondary | ICD-10-CM | POA: Diagnosis not present

## 2019-09-05 DIAGNOSIS — Z1331 Encounter for screening for depression: Secondary | ICD-10-CM | POA: Diagnosis not present

## 2019-09-05 DIAGNOSIS — N183 Chronic kidney disease, stage 3 unspecified: Secondary | ICD-10-CM | POA: Diagnosis not present

## 2019-09-05 DIAGNOSIS — L609 Nail disorder, unspecified: Secondary | ICD-10-CM | POA: Diagnosis not present

## 2019-09-05 DIAGNOSIS — N400 Enlarged prostate without lower urinary tract symptoms: Secondary | ICD-10-CM | POA: Diagnosis not present

## 2019-09-05 DIAGNOSIS — D649 Anemia, unspecified: Secondary | ICD-10-CM | POA: Diagnosis not present

## 2019-09-05 DIAGNOSIS — E785 Hyperlipidemia, unspecified: Secondary | ICD-10-CM | POA: Diagnosis not present

## 2019-09-05 DIAGNOSIS — F0390 Unspecified dementia without behavioral disturbance: Secondary | ICD-10-CM | POA: Diagnosis not present

## 2019-09-05 DIAGNOSIS — I4891 Unspecified atrial fibrillation: Secondary | ICD-10-CM | POA: Diagnosis not present

## 2019-09-16 DIAGNOSIS — Z23 Encounter for immunization: Secondary | ICD-10-CM | POA: Diagnosis not present

## 2019-09-19 DIAGNOSIS — I10 Essential (primary) hypertension: Secondary | ICD-10-CM | POA: Diagnosis not present

## 2019-09-19 DIAGNOSIS — B351 Tinea unguium: Secondary | ICD-10-CM | POA: Diagnosis not present

## 2019-09-19 DIAGNOSIS — I739 Peripheral vascular disease, unspecified: Secondary | ICD-10-CM | POA: Diagnosis not present

## 2019-09-19 DIAGNOSIS — E785 Hyperlipidemia, unspecified: Secondary | ICD-10-CM | POA: Diagnosis not present

## 2019-09-19 DIAGNOSIS — L853 Xerosis cutis: Secondary | ICD-10-CM | POA: Diagnosis not present

## 2019-09-26 DIAGNOSIS — K219 Gastro-esophageal reflux disease without esophagitis: Secondary | ICD-10-CM | POA: Diagnosis not present

## 2019-09-26 DIAGNOSIS — I10 Essential (primary) hypertension: Secondary | ICD-10-CM | POA: Diagnosis not present

## 2019-09-26 DIAGNOSIS — F0150 Vascular dementia without behavioral disturbance: Secondary | ICD-10-CM | POA: Diagnosis not present

## 2019-09-26 DIAGNOSIS — I4891 Unspecified atrial fibrillation: Secondary | ICD-10-CM | POA: Diagnosis not present

## 2019-10-03 DIAGNOSIS — K222 Esophageal obstruction: Secondary | ICD-10-CM | POA: Diagnosis not present

## 2019-10-03 DIAGNOSIS — K219 Gastro-esophageal reflux disease without esophagitis: Secondary | ICD-10-CM | POA: Diagnosis not present

## 2019-10-03 DIAGNOSIS — K449 Diaphragmatic hernia without obstruction or gangrene: Secondary | ICD-10-CM | POA: Diagnosis not present

## 2019-10-18 DIAGNOSIS — Z23 Encounter for immunization: Secondary | ICD-10-CM | POA: Diagnosis not present

## 2019-12-06 DIAGNOSIS — I4891 Unspecified atrial fibrillation: Secondary | ICD-10-CM | POA: Diagnosis not present

## 2019-12-06 DIAGNOSIS — I10 Essential (primary) hypertension: Secondary | ICD-10-CM | POA: Diagnosis not present

## 2020-01-02 DIAGNOSIS — L853 Xerosis cutis: Secondary | ICD-10-CM | POA: Diagnosis not present

## 2020-01-02 DIAGNOSIS — I739 Peripheral vascular disease, unspecified: Secondary | ICD-10-CM | POA: Diagnosis not present

## 2020-01-02 DIAGNOSIS — B351 Tinea unguium: Secondary | ICD-10-CM | POA: Diagnosis not present

## 2020-04-09 DIAGNOSIS — L853 Xerosis cutis: Secondary | ICD-10-CM | POA: Diagnosis not present

## 2020-04-09 DIAGNOSIS — I739 Peripheral vascular disease, unspecified: Secondary | ICD-10-CM | POA: Diagnosis not present

## 2020-04-09 DIAGNOSIS — D492 Neoplasm of unspecified behavior of bone, soft tissue, and skin: Secondary | ICD-10-CM | POA: Diagnosis not present

## 2020-04-09 DIAGNOSIS — B351 Tinea unguium: Secondary | ICD-10-CM | POA: Diagnosis not present

## 2020-05-26 DIAGNOSIS — Z23 Encounter for immunization: Secondary | ICD-10-CM | POA: Diagnosis not present

## 2020-07-09 DIAGNOSIS — M79675 Pain in left toe(s): Secondary | ICD-10-CM | POA: Diagnosis not present

## 2020-07-09 DIAGNOSIS — M79674 Pain in right toe(s): Secondary | ICD-10-CM | POA: Diagnosis not present

## 2020-07-09 DIAGNOSIS — D492 Neoplasm of unspecified behavior of bone, soft tissue, and skin: Secondary | ICD-10-CM | POA: Diagnosis not present

## 2020-07-09 DIAGNOSIS — L853 Xerosis cutis: Secondary | ICD-10-CM | POA: Diagnosis not present

## 2020-07-09 DIAGNOSIS — B351 Tinea unguium: Secondary | ICD-10-CM | POA: Diagnosis not present

## 2020-07-09 DIAGNOSIS — I739 Peripheral vascular disease, unspecified: Secondary | ICD-10-CM | POA: Diagnosis not present

## 2023-01-07 DEATH — deceased
